# Patient Record
Sex: Female | Born: 1950 | Race: White | Hispanic: No | Marital: Married | Smoking: Never smoker
Health system: Southern US, Community
[De-identification: ages and names within clinical notes are randomized; demographics above are authoritative.]

## PROBLEM LIST (undated history)

## (undated) DIAGNOSIS — S52501A Unspecified fracture of the lower end of right radius, initial encounter for closed fracture: Secondary | ICD-10-CM

## (undated) DIAGNOSIS — R112 Nausea with vomiting, unspecified: Secondary | ICD-10-CM

## (undated) DIAGNOSIS — I10 Essential (primary) hypertension: Secondary | ICD-10-CM

## (undated) DIAGNOSIS — I779 Disorder of arteries and arterioles, unspecified: Secondary | ICD-10-CM

## (undated) DIAGNOSIS — M1990 Unspecified osteoarthritis, unspecified site: Secondary | ICD-10-CM

## (undated) DIAGNOSIS — K219 Gastro-esophageal reflux disease without esophagitis: Secondary | ICD-10-CM

## (undated) HISTORY — PX: TONSILLECTOMY AND ADENOIDECTOMY: SUR1326

## (undated) HISTORY — DX: Essential (primary) hypertension: I10

## (undated) HISTORY — DX: Disorder of arteries and arterioles, unspecified: I77.9

---

## 2015-04-28 ENCOUNTER — Other Ambulatory Visit: Payer: Self-pay | Admitting: Orthopedic Surgery

## 2015-05-04 ENCOUNTER — Encounter (HOSPITAL_BASED_OUTPATIENT_CLINIC_OR_DEPARTMENT_OTHER): Payer: Self-pay | Admitting: *Deleted

## 2015-05-04 ENCOUNTER — Ambulatory Visit (HOSPITAL_BASED_OUTPATIENT_CLINIC_OR_DEPARTMENT_OTHER): Payer: Self-pay | Admitting: Anesthesiology

## 2015-05-04 ENCOUNTER — Ambulatory Visit (HOSPITAL_BASED_OUTPATIENT_CLINIC_OR_DEPARTMENT_OTHER)
Admission: RE | Admit: 2015-05-04 | Discharge: 2015-05-04 | Disposition: A | Discharge status: Home / Self Care | Payer: Self-pay | Source: Ambulatory Visit | Attending: Orthopedic Surgery | Admitting: Orthopedic Surgery

## 2015-05-04 ENCOUNTER — Encounter (HOSPITAL_BASED_OUTPATIENT_CLINIC_OR_DEPARTMENT_OTHER)
Admission: RE | Disposition: A | Discharge status: Home / Self Care | Payer: Self-pay | Source: Ambulatory Visit | Attending: Orthopedic Surgery

## 2015-05-04 DIAGNOSIS — K219 Gastro-esophageal reflux disease without esophagitis: Secondary | ICD-10-CM | POA: Insufficient documentation

## 2015-05-04 DIAGNOSIS — S52501A Unspecified fracture of the lower end of right radius, initial encounter for closed fracture: Secondary | ICD-10-CM | POA: Diagnosis present

## 2015-05-04 DIAGNOSIS — I10 Essential (primary) hypertension: Secondary | ICD-10-CM | POA: Insufficient documentation

## 2015-05-04 DIAGNOSIS — X58XXXA Exposure to other specified factors, initial encounter: Secondary | ICD-10-CM | POA: Insufficient documentation

## 2015-05-04 HISTORY — DX: Unspecified fracture of the lower end of right radius, initial encounter for closed fracture: S52.501A

## 2015-05-04 HISTORY — DX: Essential (primary) hypertension: I10

## 2015-05-04 HISTORY — DX: Gastro-esophageal reflux disease without esophagitis: K21.9

## 2015-05-04 HISTORY — PX: OPEN REDUCTION INTERNAL FIXATION (ORIF) DISTAL RADIAL FRACTURE: SHX5989

## 2015-05-04 LAB — POCT I-STAT, CHEM 8
BUN: 18 mg/dL (ref 6–20)
CALCIUM ION: 1.14 mmol/L (ref 1.13–1.30)
Chloride: 108 mmol/L (ref 101–111)
Creatinine, Ser: 0.8 mg/dL (ref 0.44–1.00)
Glucose, Bld: 105 mg/dL — ABNORMAL HIGH (ref 65–99)
HEMATOCRIT: 38 % (ref 36.0–46.0)
HEMOGLOBIN: 12.9 g/dL (ref 12.0–15.0)
Potassium: 4.1 mmol/L (ref 3.5–5.1)
SODIUM: 140 mmol/L (ref 135–145)
TCO2: 23 mmol/L (ref 0–100)

## 2015-05-04 SURGERY — OPEN REDUCTION INTERNAL FIXATION (ORIF) DISTAL RADIUS FRACTURE
Anesthesia: General | Site: Wrist | Laterality: Right

## 2015-05-04 MED ORDER — HYDROCODONE-ACETAMINOPHEN 10-325 MG PO TABS: 1.0000 | ORAL_TABLET | Freq: Four times a day (QID) | ORAL | Status: DC | PRN

## 2015-05-04 MED ORDER — SENNA-DOCUSATE SODIUM 8.6-50 MG PO TABS: 2.0000 | ORAL_TABLET | Freq: Every day | ORAL | Status: DC

## 2015-05-04 MED ORDER — PROPOFOL 500 MG/50ML IV EMUL
INTRAVENOUS | Status: AC
  Filled 2015-05-04: qty 50

## 2015-05-04 MED ORDER — MIDAZOLAM HCL 2 MG/2ML IJ SOLN
1.0000 mg | INTRAMUSCULAR | Status: DC | PRN
  Administered 2015-05-04 (×2): 1 mg via INTRAVENOUS

## 2015-05-04 MED ORDER — HYDROMORPHONE HCL 1 MG/ML IJ SOLN: 0.2500 mg | INTRAMUSCULAR | Status: DC | PRN

## 2015-05-04 MED ORDER — OXYCODONE HCL 5 MG PO TABS: 5.0000 mg | ORAL_TABLET | Freq: Once | ORAL | Status: DC | PRN

## 2015-05-04 MED ORDER — EPHEDRINE SULFATE 50 MG/ML IJ SOLN
INTRAMUSCULAR | Status: DC | PRN
  Administered 2015-05-04: 15 mg via INTRAVENOUS

## 2015-05-04 MED ORDER — LACTATED RINGERS IV SOLN
INTRAVENOUS | Status: DC
  Administered 2015-05-04 (×2): via INTRAVENOUS

## 2015-05-04 MED ORDER — OXYCODONE HCL 5 MG/5ML PO SOLN: 5.0000 mg | Freq: Once | ORAL | Status: DC | PRN

## 2015-05-04 MED ORDER — GLYCOPYRROLATE 0.2 MG/ML IJ SOLN: 0.2000 mg | Freq: Once | INTRAMUSCULAR | Status: DC | PRN

## 2015-05-04 MED ORDER — ATROPINE SULFATE 0.4 MG/ML IJ SOLN
INTRAMUSCULAR | Status: AC
  Filled 2015-05-04: qty 1

## 2015-05-04 MED ORDER — FENTANYL CITRATE (PF) 100 MCG/2ML IJ SOLN
INTRAMUSCULAR | Status: AC
  Filled 2015-05-04: qty 2

## 2015-05-04 MED ORDER — ONDANSETRON HCL 4 MG/2ML IJ SOLN
INTRAMUSCULAR | Status: AC
  Filled 2015-05-04: qty 2

## 2015-05-04 MED ORDER — DEXAMETHASONE SODIUM PHOSPHATE 4 MG/ML IJ SOLN
INTRAMUSCULAR | Status: DC | PRN
  Administered 2015-05-04: 8 mg via INTRAVENOUS

## 2015-05-04 MED ORDER — SUCCINYLCHOLINE 20MG/ML (10ML) SYRINGE FOR MEDFUSION PUMP - OPTIME
INTRAMUSCULAR | Status: DC | PRN
  Administered 2015-05-04: 80 mg via INTRAVENOUS

## 2015-05-04 MED ORDER — MEPERIDINE HCL 25 MG/ML IJ SOLN: 6.2500 mg | INTRAMUSCULAR | Status: DC | PRN

## 2015-05-04 MED ORDER — PROPOFOL 10 MG/ML IV BOLUS
INTRAVENOUS | Status: DC | PRN
  Administered 2015-05-04: 50 mg via INTRAVENOUS
  Administered 2015-05-04: 150 mg via INTRAVENOUS

## 2015-05-04 MED ORDER — ONDANSETRON HCL 4 MG/2ML IJ SOLN
INTRAMUSCULAR | Status: DC | PRN
  Administered 2015-05-04: 4 mg via INTRAVENOUS

## 2015-05-04 MED ORDER — LIDOCAINE HCL (CARDIAC) 20 MG/ML IV SOLN
INTRAVENOUS | Status: AC
  Filled 2015-05-04: qty 5

## 2015-05-04 MED ORDER — PHENYLEPHRINE HCL 10 MG/ML IJ SOLN
10.0000 mg | INTRAVENOUS | Status: DC | PRN
  Administered 2015-05-04: 40 ug/min via INTRAVENOUS

## 2015-05-04 MED ORDER — CEFAZOLIN SODIUM-DEXTROSE 2-4 GM/100ML-% IV SOLN
INTRAVENOUS | Status: AC
  Filled 2015-05-04: qty 100

## 2015-05-04 MED ORDER — CEFAZOLIN SODIUM-DEXTROSE 2-4 GM/100ML-% IV SOLN
2.0000 g | INTRAVENOUS | Status: AC
  Administered 2015-05-04: 2 g via INTRAVENOUS

## 2015-05-04 MED ORDER — DEXAMETHASONE SODIUM PHOSPHATE 10 MG/ML IJ SOLN
INTRAMUSCULAR | Status: AC
  Filled 2015-05-04: qty 1

## 2015-05-04 MED ORDER — MIDAZOLAM HCL 2 MG/2ML IJ SOLN
INTRAMUSCULAR | Status: AC
Start: 2015-05-04 — End: 2015-05-04
  Filled 2015-05-04: qty 2

## 2015-05-04 MED ORDER — FENTANYL CITRATE (PF) 100 MCG/2ML IJ SOLN
50.0000 ug | INTRAMUSCULAR | Status: AC | PRN
  Administered 2015-05-04 (×2): 25 ug via INTRAVENOUS
  Administered 2015-05-04: 100 ug via INTRAVENOUS

## 2015-05-04 MED ORDER — SUCCINYLCHOLINE CHLORIDE 20 MG/ML IJ SOLN
INTRAMUSCULAR | Status: AC
  Filled 2015-05-04: qty 1

## 2015-05-04 MED ORDER — EPHEDRINE SULFATE 50 MG/ML IJ SOLN
INTRAMUSCULAR | Status: AC
  Filled 2015-05-04: qty 1

## 2015-05-04 MED ORDER — PHENYLEPHRINE 40 MCG/ML (10ML) SYRINGE FOR IV PUSH (FOR BLOOD PRESSURE SUPPORT)
PREFILLED_SYRINGE | INTRAVENOUS | Status: AC
  Filled 2015-05-04: qty 10

## 2015-05-04 MED ORDER — MIDAZOLAM HCL 2 MG/2ML IJ SOLN
INTRAMUSCULAR | Status: AC
  Filled 2015-05-04: qty 2

## 2015-05-04 MED ORDER — SCOPOLAMINE 1 MG/3DAYS TD PT72: 1.0000 | MEDICATED_PATCH | Freq: Once | TRANSDERMAL | Status: DC | PRN

## 2015-05-04 MED ORDER — BUPIVACAINE-EPINEPHRINE (PF) 0.5% -1:200000 IJ SOLN
INTRAMUSCULAR | Status: DC | PRN
  Administered 2015-05-04: 25 mL via PERINEURAL

## 2015-05-04 SURGICAL SUPPLY — 68 items
BANDAGE ACE 3X5.8 VEL STRL LF (GAUZE/BANDAGES/DRESSINGS) ×3 IMPLANT
BANDAGE ACE 4X5 VEL STRL LF (GAUZE/BANDAGES/DRESSINGS) IMPLANT
BIT DRILL 2 FAST STEP (BIT) ×3 IMPLANT
BIT DRILL 2.5X4 QC (BIT) ×3 IMPLANT
BLADE MINI RND TIP GREEN BEAV (BLADE) IMPLANT
BLADE SURG 15 STRL LF DISP TIS (BLADE) ×1 IMPLANT
BLADE SURG 15 STRL SS (BLADE) ×2
BNDG COHESIVE 4X5 TAN STRL (GAUZE/BANDAGES/DRESSINGS) ×3 IMPLANT
BNDG ESMARK 4X9 LF (GAUZE/BANDAGES/DRESSINGS) ×3 IMPLANT
CLOSURE STERI-STRIP 1/2X4 (GAUZE/BANDAGES/DRESSINGS) ×1
CLSR STERI-STRIP ANTIMIC 1/2X4 (GAUZE/BANDAGES/DRESSINGS) ×2 IMPLANT
CORDS BIPOLAR (ELECTRODE) ×3 IMPLANT
COVER BACK TABLE 60X90IN (DRAPES) ×3 IMPLANT
CUFF TOURNIQUET SINGLE 18IN (TOURNIQUET CUFF) ×3 IMPLANT
DECANTER SPIKE VIAL GLASS SM (MISCELLANEOUS) IMPLANT
DRAPE EXTREMITY T 121X128X90 (DRAPE) ×3 IMPLANT
DRAPE IMP U-DRAPE 54X76 (DRAPES) ×3 IMPLANT
DRAPE OEC MINIVIEW 54X84 (DRAPES) ×3 IMPLANT
DRAPE SURG 17X23 STRL (DRAPES) ×3 IMPLANT
DURAPREP 26ML APPLICATOR (WOUND CARE) ×3 IMPLANT
GAUZE SPONGE 4X4 12PLY STRL (GAUZE/BANDAGES/DRESSINGS) ×3 IMPLANT
GLOVE BIO SURGEON STRL SZ8 (GLOVE) ×3 IMPLANT
GLOVE BIOGEL PI IND STRL 7.0 (GLOVE) ×2 IMPLANT
GLOVE BIOGEL PI IND STRL 8 (GLOVE) ×2 IMPLANT
GLOVE BIOGEL PI INDICATOR 7.0 (GLOVE) ×4
GLOVE BIOGEL PI INDICATOR 8 (GLOVE) ×4
GLOVE ECLIPSE 6.5 STRL STRAW (GLOVE) ×3 IMPLANT
GLOVE ORTHO TXT STRL SZ7.5 (GLOVE) ×3 IMPLANT
GOWN STRL REUS W/ TWL LRG LVL3 (GOWN DISPOSABLE) ×1 IMPLANT
GOWN STRL REUS W/ TWL XL LVL3 (GOWN DISPOSABLE) ×2 IMPLANT
GOWN STRL REUS W/TWL LRG LVL3 (GOWN DISPOSABLE) ×2
GOWN STRL REUS W/TWL XL LVL3 (GOWN DISPOSABLE) ×4
K-WIRE 1.6 (WIRE) ×2
K-WIRE FX5X1.6XNS BN SS (WIRE) ×1
KWIRE FX5X1.6XNS BN SS (WIRE) ×1 IMPLANT
NEEDLE HYPO 25X1 1.5 SAFETY (NEEDLE) IMPLANT
NS IRRIG 1000ML POUR BTL (IV SOLUTION) ×3 IMPLANT
PACK BASIN DAY SURGERY FS (CUSTOM PROCEDURE TRAY) ×3 IMPLANT
PAD CAST 3X4 CTTN HI CHSV (CAST SUPPLIES) ×1 IMPLANT
PAD CAST 4YDX4 CTTN HI CHSV (CAST SUPPLIES) IMPLANT
PADDING CAST ABS 4INX4YD NS (CAST SUPPLIES) ×2
PADDING CAST ABS COTTON 4X4 ST (CAST SUPPLIES) ×1 IMPLANT
PADDING CAST COTTON 3X4 STRL (CAST SUPPLIES) ×2
PADDING CAST COTTON 4X4 STRL (CAST SUPPLIES)
PEG SUBCHONDRAL SMOOTH 2.0X16 (Peg) ×6 IMPLANT
PEG SUBCHONDRAL SMOOTH 2.0X18 (Peg) ×12 IMPLANT
PLATE SHORT 21.6X48.9 NRRW RT (Plate) ×3 IMPLANT
SCREW BN 12X3.5XNS CORT TI (Screw) ×2 IMPLANT
SCREW CORT 3.5X12 (Screw) ×4 IMPLANT
SLEEVE SCD COMPRESS KNEE MED (MISCELLANEOUS) ×3 IMPLANT
SLING ARM FOAM STRAP MED (SOFTGOODS) ×3 IMPLANT
SPLINT PLASTER CAST XFAST 3X15 (CAST SUPPLIES) ×10 IMPLANT
SPLINT PLASTER XTRA FASTSET 3X (CAST SUPPLIES) ×20
SUCTION FRAZIER HANDLE 10FR (MISCELLANEOUS) ×2
SUCTION TUBE FRAZIER 10FR DISP (MISCELLANEOUS) ×1 IMPLANT
SUT ETHILON 3 0 PS 1 (SUTURE) IMPLANT
SUT ETHILON 4 0 PS 2 18 (SUTURE) IMPLANT
SUT MNCRL AB 4-0 PS2 18 (SUTURE) IMPLANT
SUT VIC AB 0 CT1 27 (SUTURE)
SUT VIC AB 0 CT1 27XBRD ANBCTR (SUTURE) IMPLANT
SUT VICRYL 3-0 CR8 SH (SUTURE) ×3 IMPLANT
SYR BULB 3OZ (MISCELLANEOUS) ×3 IMPLANT
SYR CONTROL 10ML LL (SYRINGE) IMPLANT
TOWEL OR 17X24 6PK STRL BLUE (TOWEL DISPOSABLE) ×3 IMPLANT
TOWEL OR NON WOVEN STRL DISP B (DISPOSABLE) ×3 IMPLANT
TUBE CONNECTING 20'X1/4 (TUBING) ×1
TUBE CONNECTING 20X1/4 (TUBING) ×2 IMPLANT
UNDERPAD 30X30 (UNDERPADS AND DIAPERS) IMPLANT

## 2015-05-04 NOTE — Anesthesia Postprocedure Evaluation (Signed)
Anesthesia Post Note  Patient: Katie Chan  Procedure(s) Performed: Procedure(s) (LRB): OPEN REDUCTION INTERNAL FIXATION (ORIF) RIGHT DISTAL RADIUS FRACTURE (Right)  Patient location during evaluation: PACU Anesthesia Type: General Level of consciousness: awake and alert Pain management: pain level controlled Vital Signs Assessment: post-procedure vital signs reviewed and stable Respiratory status: spontaneous breathing, nonlabored ventilation and respiratory function stable Cardiovascular status: blood pressure returned to baseline and stable Postop Assessment: no signs of nausea or vomiting Anesthetic complications: no    Last Vitals:  Filed Vitals:   05/04/15 1234 05/04/15 1245  BP: 141/69 138/65  Pulse: 99 92  Temp: 36.6 C   Resp: 39 15    Last Pain: There were no vitals filed for this visit.               Dillard Pascal A

## 2015-05-04 NOTE — H&P (Signed)
PREOPERATIVE H&P  Chief Complaint: RIGHT DISTAL RADIAL FRACTURE  HPI: Katie Chan is a 65 y.o. female who presents for preoperative history and physical with a diagnosis of RIGHT DISTAL RADIAL FRACTURE. Symptoms are rated as moderate to severe, and have been worsening.  This is significantly impairing activities of daily living.  She has elected for surgical management. This was an acute injury, after a fall that happened last week. Denies any loss of sensation. Positive pain that is severe over the right wrist. She is right-hand dominant.  Past Medical History  Diagnosis Date  . Hypertension   . GERD (gastroesophageal reflux disease)    Past Surgical History  Procedure Laterality Date  . Tonsillectomy and adenoidectomy     Social History   Social History  . Marital Status: Married    Spouse Name: N/A  . Number of Children: N/A  . Years of Education: N/A   Social History Main Topics  . Smoking status: Never Smoker   . Smokeless tobacco: None  . Alcohol Use: None  . Drug Use: None  . Sexual Activity: Not Asked   Other Topics Concern  . None   Social History Narrative  . None   History reviewed. No pertinent family history. No Known Allergies Prior to Admission medications   Medication Sig Start Date End Date Taking? Authorizing Provider  HYDROcodone-acetaminophen (NORCO/VICODIN) 5-325 MG tablet Take 1 tablet by mouth every 4 (four) hours as needed for moderate pain.   Yes Historical Provider, MD  lisinopril-hydrochlorothiazide (PRINZIDE,ZESTORETIC) 20-12.5 MG tablet Take 1 tablet by mouth 2 (two) times daily.   Yes Historical Provider, MD  metoprolol tartrate (LOPRESSOR) 25 MG tablet Take 25 mg by mouth 2 (two) times daily.   Yes Historical Provider, MD  sertraline (ZOLOFT) 50 MG tablet Take 50 mg by mouth daily.   Yes Historical Provider, MD     Positive ROS: All other systems have been reviewed and were otherwise negative with the exception of those mentioned in  the HPI and as above.  Physical Exam: General: Alert, no acute distress Cardiovascular: No pedal edema Respiratory: No cyanosis, no use of accessory musculature GI: No organomegaly, abdomen is soft and non-tender Skin: No lesions in the area of chief complaint Neurologic: Sensation intact distally Psychiatric: Patient is competent for consent with normal mood and affect Lymphatic: No axillary or cervical lymphadenopathy  MUSCULOSKELETAL: Right hand has sensation intact throughout, all fingers flex extend and abduct, positive ecchymosis and tenderness to palpation over the distal radius.  Assessment: RIGHT DISTAL RADIAL FRACTURE   Plan: Plan for Procedure(s): OPEN REDUCTION INTERNAL FIXATION (ORIF) RIGHT DISTAL RADIUS FRACTURE  The risks benefits and alternatives were discussed with the patient including but not limited to the risks of nonoperative treatment, versus surgical intervention including infection, bleeding, nerve injury, malunion, nonunion, the need for revision surgery, hardware prominence, hardware failure, the need for hardware removal, blood clots, cardiopulmonary complications, morbidity, mortality, among others, and they were willing to proceed.     Eulas PostLANDAU,Ilsa Bonello P, MD Cell [phone]   05/04/2015 10:59 AM

## 2015-05-04 NOTE — Anesthesia Procedure Notes (Addendum)
Anesthesia Regional Block:  Infraclavicular brachial plexus block  Pre-Anesthetic Checklist: ,, timeout performed, Correct Patient, Correct Site, Correct Laterality, Correct Procedure, Correct Position, site marked, Risks and benefits discussed,  Surgical consent,  Pre-op evaluation,  At surgeon's request and post-op pain management  Laterality: Right and Upper  Prep: chloraprep       Needles:  Injection technique: Single-shot  Needle Type: Echogenic Stimulator Needle     Needle Length: 5cm 5 cm Needle Gauge: 21 and 21 G    Additional Needles:  Procedures: ultrasound guided (picture in chart) Infraclavicular brachial plexus block Narrative:  Start time: 05/04/2015 10:43 AM End time: 05/04/2015 10:48 AM Injection made incrementally with aspirations every 5 mL.  Performed by: Personally  Anesthesiologist: CREWS, DAVID   Procedure Name: LMA Insertion Date/Time: 05/04/2015 11:13 AM Performed by: Zenia ResidesPAYNE, Jameela Michna D Pre-anesthesia Checklist: Patient identified, Emergency Drugs available, Suction available and Patient being monitored Patient Re-evaluated:Patient Re-evaluated prior to inductionOxygen Delivery Method: Circle System Utilized Preoxygenation: Pre-oxygenation with 100% oxygen Intubation Type: IV induction Ventilation: Mask ventilation without difficulty LMA: LMA inserted LMA Size: 4.0 Number of attempts: 1 Airway Equipment and Method: Bite block Placement Confirmation: positive ETCO2 Tube secured with: Tape Dental Injury: Teeth and Oropharynx as per pre-operative assessment       Right Infraclavicular block image

## 2015-05-04 NOTE — Progress Notes (Signed)
Assisted Dr. Crews with right, ultrasound guided, supraclavicular block. Side rails up, monitors on throughout procedure. See vital signs in flow sheet. Tolerated Procedure well. 

## 2015-05-04 NOTE — Op Note (Signed)
05/04/2015  12:20 PM  PATIENT:  Katie Chan    PRE-OPERATIVE DIAGNOSIS:  RIGHT DISTAL RADIAL FRACTURE closed, intra-articular, displaced, acute, comminuted  POST-OPERATIVE DIAGNOSIS:  Same  PROCEDURE:  ORIF DISTAL RADIUS FRACTURE, 3 PIECES  SURGEON:  Eulas PostLANDAU,Mahalia Dykes P, MD  PHYSICIAN ASSISTANT: Janace LittenBrandon Parry, OPA-C, present and scrubbed throughout the case, critical for completion in a timely fashion, and for retraction, instrumentation, and closure.  ANESTHESIA:   General  PREOPERATIVE INDICATIONS:  Katie Chan is a  65 y.o. female with a diagnosis of RIGHT DISTAL RADIAL FRACTURE who elected for surgical management due to fracture displacement.    The risks benefits and alternatives were discussed with the patient preoperatively including but not limited to the risks of infection, bleeding, nerve injury, cardiopulmonary complications, the need for revision surgery, tendon rupture, hardware prominence, hardware failure, nonunion, malunion, post-traumatic arthritis, regional pain syndrome, among others, and the patient was willing to proceed.  OPERATIVE IMPLANTS: Biomet DVR volar plate with 2 proximal cortical screws and multiple distal interlocking smooth pegs, using the short narrow plate.   OPERATIVE FINDINGS: Comminution of the distal radius fracture. The radial styloid segment was a separate segment, which ultimately I achieved near anatomic reduction. There was just a slight bit of diastases at the junction of the lunar facet and the radial styloid scaphoid facet. Bone quality was mediocre.  OPERATIVE PROCEDURE: The patient was brought to the operating room and placed in the supine position. General anesthesia was administered. IV antibiotics were given. Time out was performed. The upper extremity was prepped and draped in usual sterile fashion. The arm was elevated and exsanguinated and the tourniquet was inflated at 250mm hg.    Volar approach to the distal radius was carried  out, and the flexor carpi radialis was retracted radially. The radial artery was protected throughout the case.   Deep dissection was carried down, and the pronator quadratus was elevated off of the radius. The fracture site was identified and cleaned and reduced anatomically. This keyed into place nicely.   I held this provisionally with a K wire, and C-arm used to confirm alignment.   I had restored height and inclination and then applied a volar plate. Once I was satisfied with the overall alignment I was able to secure the plate proximally with a cortical screw.   I then secured the fracture with multiple smooth interlocking pegs distally, and confirmed that none of these were in the joint, and none of these were penetrating the dorsal cortex. I also secured the plate proximally with one more cortical screw. On the ulnar side of the plate distally I used shorter pegs than on the radial side.    The wounds were irrigated copiously, and then closed the skin with subcutaneous Vicryl and Steri-Strips and sterile gauze and a volar splint. The tourniquet was released. She was awakened and returned back in stable and satisfactory condition. There were no complications and She tolerated the procedure well.

## 2015-05-04 NOTE — Transfer of Care (Signed)
Immediate Anesthesia Transfer of Care Note  Patient: Katie Chan  Procedure(s) Performed: Procedure(s): OPEN REDUCTION INTERNAL FIXATION (ORIF) RIGHT DISTAL RADIUS FRACTURE (Right)  Patient Location: PACU  Anesthesia Type:GA combined with regional for post-op pain  Level of Consciousness: awake, sedated and patient cooperative  Airway & Oxygen Therapy: Patient Spontanous Breathing and Patient connected to face mask oxygen  Post-op Assessment: Report given to RN and Post -op Vital signs reviewed and stable  Post vital signs: Reviewed and stable  Last Vitals:  Filed Vitals:   05/04/15 1045 05/04/15 1050  BP: 135/64 130/56  Pulse: 66 62  Temp:    Resp: 9 14    Complications: No apparent anesthesia complications

## 2015-05-04 NOTE — Anesthesia Preprocedure Evaluation (Signed)
Anesthesia Evaluation  Patient identified by MRN, date of birth, ID band Patient awake    Reviewed: Allergy & Precautions, NPO status , Patient's Chart, lab work & pertinent test results  Airway Mallampati: I  TM Distance: >3 FB Neck ROM: Full    Dental  (+) Partial Upper, Dental Advisory Given, Teeth Intact   Pulmonary    breath sounds clear to auscultation       Cardiovascular hypertension, Pt. on medications and Pt. on home beta blockers  Rhythm:Regular Rate:Normal     Neuro/Psych    GI/Hepatic GERD  Medicated and Controlled,  Endo/Other    Renal/GU      Musculoskeletal   Abdominal   Peds  Hematology   Anesthesia Other Findings   Reproductive/Obstetrics                             Anesthesia Physical Anesthesia Plan  ASA: II  Anesthesia Plan: General   Post-op Pain Management: GA combined w/ Regional for post-op pain   Induction: Intravenous  Airway Management Planned: LMA  Additional Equipment:   Intra-op Plan:   Post-operative Plan: Extubation in OR  Informed Consent: I have reviewed the patients History and Physical, chart, labs and discussed the procedure including the risks, benefits and alternatives for the proposed anesthesia with the patient or authorized representative who has indicated his/her understanding and acceptance.   Dental advisory given  Plan Discussed with: CRNA, Anesthesiologist and Surgeon  Anesthesia Plan Comments:         Anesthesia Quick Evaluation

## 2015-05-04 NOTE — Discharge Instructions (Signed)
Diet: As you were doing prior to hospitalization   Shower:  May shower but keep the wounds dry, use an occlusive plastic wrap, NO SOAKING IN TUB.  If the bandage gets wet, change with a clean dry gauze.  If you have a splint on, leave the splint in place and keep the splint dry with a plastic bag.  Dressing:  You may change your dressing 3-5 days after surgery, unless you have a splint.  If you have a splint, then just leave the splint in place and we will change your bandages during your first follow-up appointment.    If you had hand or foot surgery, we will plan to remove your stitches in about 2 weeks in the office.  For all other surgeries, there are sticky tapes (steri-strips) on your wounds and all the stitches are absorbable.  Leave the steri-strips in place when changing your dressings, they will peel off with time, usually 2-3 weeks.  Activity:  Increase activity slowly as tolerated, but follow the weight bearing instructions below.  The rules on driving is that you can not be taking narcotics while you drive, and you must feel in control of the vehicle.    Weight Bearing:   No lifting with right hand.  Ok for finger motion.    To prevent constipation: you may use a stool softener such as -  Colace (over the counter) 100 mg by mouth twice a day  Drink plenty of fluids (prune juice may be helpful) and high fiber foods Miralax (over the counter) for constipation as needed.    Itching:  If you experience itching with your medications, try taking only a single pain pill, or even half a pain pill at a time.  You may take up to 10 pain pills per day, and you can also use benadryl over the counter for itching or also to help with sleep.   Precautions:  If you experience chest pain or shortness of breath - call 911 immediately for transfer to the hospital emergency department!!  If you develop a fever greater that 101 F, purulent drainage from wound, increased redness or drainage from wound,  or calf pain -- Call the office at [phone]                                                Follow- Up Appointment:  Please call for an appointment to be [place] 2 [place] [phone]      Post Anesthesia Home Care Instructions  Activity: Get plenty of rest for the remainder of the day. A responsible adult should stay with you for 24 hours following the procedure.  For the next 24 hours, DO NOT: -Drive a car -Advertising copywriterperate machinery -Drink alcoholic beverages -Take any medication unless instructed by your physician -Make any legal decisions or sign important papers.  Meals: Start with liquid foods such as gelatin or soup. Progress to regular foods as tolerated. Avoid greasy, spicy, heavy foods. If nausea and/or vomiting occur, drink only clear liquids until the nausea and/or vomiting subsides. Call your physician if vomiting continues.  Special Instructions/Symptoms: Your throat may feel dry or sore from the anesthesia or the breathing tube placed in your throat during surgery. If this causes discomfort, gargle with warm salt water. The discomfort should disappear within 24 hours.  If you had a scopolamine  patch placed behind your ear for the management of post- operative nausea and/or vomiting:  1. The medication in the patch is effective for 72 hours, after which it should be removed.  Wrap patch in a tissue and discard in the trash. Wash hands thoroughly with soap and water. 2. You may remove the patch earlier than 72 hours if you experience unpleasant side effects which may include dry mouth, dizziness or visual disturbances. 3. Avoid touching the patch. Wash your hands with soap and water after contact with the patch.   Regional Anesthesia Blocks  1. Numbness or the inability to move the "blocked" extremity may last from 3-48 hours after placement. The length of time depends on the medication injected and your individual response to the medication. If the numbness is  not going away after 48 hours, call your surgeon.  2. The extremity that is blocked will need to be protected until the numbness is gone and the  Strength has returned. Because you cannot feel it, you will need to take extra care to avoid injury. Because it may be weak, you may have difficulty moving it or using it. You may not know what position it is in without looking at it while the block is in effect.  3. For blocks in the legs and feet, returning to weight bearing and walking needs to be done carefully. You will need to wait until the numbness is entirely gone and the strength has returned. You should be able to move your leg and foot normally before you try and bear weight or walk. You will need someone to be with you when you first try to ensure you do not fall and possibly risk injury.  4. Bruising and tenderness at the needle site are common side effects and will resolve in a few days.  5. Persistent numbness or new problems with movement should be communicated to the surgeon or the [facility] [phone] [facility] [phone]).

## 2015-05-05 ENCOUNTER — Encounter (HOSPITAL_BASED_OUTPATIENT_CLINIC_OR_DEPARTMENT_OTHER): Payer: Self-pay | Admitting: Orthopedic Surgery

## 2017-03-14 DIAGNOSIS — I10 Essential (primary) hypertension: Secondary | ICD-10-CM | POA: Diagnosis not present

## 2017-03-14 DIAGNOSIS — R5383 Other fatigue: Secondary | ICD-10-CM | POA: Diagnosis not present

## 2017-06-21 DIAGNOSIS — R079 Chest pain, unspecified: Secondary | ICD-10-CM | POA: Diagnosis not present

## 2017-06-21 DIAGNOSIS — I10 Essential (primary) hypertension: Secondary | ICD-10-CM | POA: Diagnosis not present

## 2017-06-21 DIAGNOSIS — R5383 Other fatigue: Secondary | ICD-10-CM | POA: Diagnosis not present

## 2017-07-18 DIAGNOSIS — I10 Essential (primary) hypertension: Secondary | ICD-10-CM | POA: Diagnosis not present

## 2017-07-18 DIAGNOSIS — N183 Chronic kidney disease, stage 3 (moderate): Secondary | ICD-10-CM | POA: Diagnosis not present

## 2017-07-18 DIAGNOSIS — R5383 Other fatigue: Secondary | ICD-10-CM | POA: Diagnosis not present

## 2017-08-06 ENCOUNTER — Encounter: Payer: Self-pay | Admitting: *Deleted

## 2017-08-07 ENCOUNTER — Telehealth: Payer: Self-pay | Admitting: Cardiovascular Disease

## 2017-08-07 ENCOUNTER — Ambulatory Visit: Payer: Medicare Other | Admitting: Cardiovascular Disease

## 2017-08-07 ENCOUNTER — Encounter: Payer: Self-pay | Admitting: Cardiovascular Disease

## 2017-08-07 ENCOUNTER — Encounter: Payer: Self-pay | Admitting: *Deleted

## 2017-08-07 VITALS — BP 158/82 | HR 56 | Ht 62.0 in | Wt 147.4 lb

## 2017-08-07 DIAGNOSIS — R0602 Shortness of breath: Secondary | ICD-10-CM

## 2017-08-07 DIAGNOSIS — R42 Dizziness and giddiness: Secondary | ICD-10-CM

## 2017-08-07 DIAGNOSIS — I10 Essential (primary) hypertension: Secondary | ICD-10-CM | POA: Diagnosis not present

## 2017-08-07 DIAGNOSIS — R079 Chest pain, unspecified: Secondary | ICD-10-CM

## 2017-08-07 DIAGNOSIS — R001 Bradycardia, unspecified: Secondary | ICD-10-CM | POA: Diagnosis not present

## 2017-08-07 NOTE — Patient Instructions (Addendum)
Medication Instructions:  Continue all current medications.  Labwork: none  Testing/Procedures:  Your physician has requested that you have an exercise stress myoview. For further information please visit [email or web address]. Please follow instruction sheet, as given.  Your physician has recommended that you wear a 14 day event monitor. Event monitors are medical devices that record the heart's electrical activity. Doctors most often us these monitors to diagnose arrhythmias. Arrhythmias are problems with the speed or rhythm of the heartbeat. The monitor is a small, portable device. You can wear one while you do your normal daily activities. This is usually used to diagnose what is causing palpitations/syncope (passing out).  Office will contact with results via phone or letter.    Follow-Up: Next available.    Any Other Special Instructions Will Be Listed Below (If Applicable).  If you need a refill on your cardiac medications before your next appointment, please call your pharmacy.

## 2017-08-07 NOTE — Telephone Encounter (Signed)
Pre-cert Verification for the following procedure   Exercise myoview - chest pain scheduled for 08-09-2017

## 2017-08-07 NOTE — Progress Notes (Signed)
CARDIOLOGY CONSULT NOTE  Patient ID: Katie Chan MRN: [id number] DOB/AGE: 66-Jan-1952 66 y.o.  Admit date: (Not on file) Primary Physician: [name], [name], PA Referring Physician: Dr. Selinda FlavinKevin Howard  Reason for Consultation: Bradycardia and chest pain  HPI: Katie Chan is a 67 y.o. female who is being seen today for the evaluation of bradycardia and chest pain at the request of Selinda FlavinHoward, Kevin, MD.   Past medical history includes hypertension.  I reviewed extensive documentation from her PCP.  She tells me that for the past 2 months she has been experiencing retrosternal chest pressure associate with fatigue.  He can occur after walking her dog or doing routine activities of daily living around the house.  There is some radiation to the throat.  She also has bilateral arm tingling and numbness which can be associated with chest pressure or independently.  She describes that as a cramp-like sensation with symptoms lasting for less than a minute.  She has some dizziness but thought it might be related to cataracts.  She denies syncope.  She also has some shortness of breath.  She has some ankle and feet swelling as well.  I personally reviewed an ECG performed at her PCPs office which demonstrates sinus bradycardia, 51 bpm.  This was performed on 06/21/2017.  I reviewed labs dated 06/21/2017: BUN 25, creatinine 1.17, sodium 144, potassium 4.9, white blood cell 7.7, hemoglobin 11.2, platelets 302, total cholesterol 210, triglycerides 130, HDL 62, LDL 122.  Family history: Mother died of an MI at age 66.  Maternal grandmother died of an MI.  Father had 2 MIs, first one in his 6560s.  He was a smoker and also had COPD.  Paternal grandfather died of an MI at age 842.   No Known Allergies  Current Outpatient Medications  Medication Sig Dispense Refill  . Aspirin-Salicylamide-Caffeine (BC HEADACHE POWDER PO) Take 1 packet by mouth daily as needed.    . hydrALAZINE (APRESOLINE) 25  MG tablet Take 25 mg by mouth 3 (three) times daily.  0  . lisinopril-hydrochlorothiazide (PRINZIDE,ZESTORETIC) 20-12.5 MG tablet Take 1 tablet by mouth 2 (two) times daily.     No current facility-administered medications for this visit.     Past Medical History:  Diagnosis Date  . Fracture of radius, distal, right, closed 05/04/2015  . GERD (gastroesophageal reflux disease)   . Hypertension     Past Surgical History:  Procedure Laterality Date  . OPEN REDUCTION INTERNAL FIXATION (ORIF) DISTAL RADIAL FRACTURE Right 05/04/2015   Procedure: OPEN REDUCTION INTERNAL FIXATION (ORIF) RIGHT DISTAL RADIUS FRACTURE;  Surgeon: [name], MD;  Location: [facility];  Service: Orthopedics;  Laterality: Right;  . TONSILLECTOMY AND ADENOIDECTOMY      Social History   Socioeconomic History  . Marital status: Married    Spouse name: Not on file  . Number of children: Not on file  . Years of education: Not on file  . Highest education level: Not on file  Occupational History  . Not on file  Social Needs  . Financial resource strain: Not on file  . Food insecurity:    Worry: Not on file    Inability: Not on file  . Transportation needs:    Medical: Not on file    Non-medical: Not on file  Tobacco Use  . Smoking status: Never Smoker  . Smokeless tobacco: Never Used  Substance and Sexual Activity  . Alcohol use: Not on file  .  Drug use: Not on file  . Sexual activity: Not on file  Lifestyle  . Physical activity:    Days per week: Not on file    Minutes per session: Not on file  . Stress: Not on file  Relationships  . Social connections:    Talks on phone: Not on file    Gets together: Not on file    Attends religious service: Not on file    Active member of club or organization: Not on file    Attends meetings of clubs or organizations: Not on file    Relationship status: Not on file  . Intimate partner violence:    Fear of current or ex partner: Not on file     Emotionally abused: Not on file    Physically abused: Not on file    Forced sexual activity: Not on file  Other Topics Concern  . Not on file  Social History Narrative  . Not on file      Current Meds  Medication Sig  . Aspirin-Salicylamide-Caffeine (BC HEADACHE POWDER PO) Take 1 packet by mouth daily as needed.  . hydrALAZINE (APRESOLINE) 25 MG tablet Take 25 mg by mouth 3 (three) times daily.  Marland Kitchen lisinopril-hydrochlorothiazide (PRINZIDE,ZESTORETIC) 20-12.5 MG tablet Take 1 tablet by mouth 2 (two) times daily.      Review of systems complete and found to be negative unless listed above in HPI    Physical exam Blood pressure (!) 158/82, pulse (!) 56, height 5\' 2"  (1.575 m), weight 147 lb 6.4 oz (66.9 kg), SpO2 98 %. General: NAD Neck: No JVD, no thyromegaly or thyroid nodule.  Lungs: Clear to auscultation bilaterally with normal respiratory effort. CV: Nondisplaced PMI.  Bradycardic, regular rhythm, normal S1/S2, no S3/S4, 1/6 systolic murmur along left sternal border.  No peripheral edema.  No carotid bruit.  Abdomen: Soft, nontender, no distention.  Skin: Intact without lesions or rashes.  Neurologic: Alert and oriented x 3.  Psych: Normal affect. Extremities: No clubbing or cyanosis.  HEENT: Normal.   ECG: Most recent ECG reviewed.   Labs: Lab Results  Component Value Date/Time   K 4.1 05/04/2015 09:44 AM   BUN 18 05/04/2015 09:44 AM   CREATININE 0.80 05/04/2015 09:44 AM   HGB 12.9 05/04/2015 09:44 AM     Lipids: No results found for: LDLCALC, LDLDIRECT, CHOL, TRIG, HDL      ASSESSMENT AND PLAN:  1.  Chest pressure with shortness of breath: Symptoms have some typical features as they occur with exertion and with radiation to the throat.  There is a family history of heart disease.  Risk factors also include hypertension.  I will obtain an exercise Myoview stress test to evaluate for ischemic heart disease.  2.  Bradycardia with dizziness: No history of sleep  apnea or thyroid disease.  I will obtain a 2-week event monitor to evaluate for symptomatic bradycardia.    3.  Hypertension: Blood pressure is elevated today.  This will need continued monitoring.  Currently on hydralazine and Prinzide.   Disposition: Follow up in 2 months   Signed: [name], M.D., F.A.C.C.  08/07/2017, 9:30 AM

## 2017-08-09 ENCOUNTER — Encounter (HOSPITAL_COMMUNITY): Payer: Self-pay

## 2017-08-09 ENCOUNTER — Encounter (HOSPITAL_COMMUNITY)
Admission: RE | Admit: 2017-08-09 | Discharge: 2017-08-09 | Disposition: A | Discharge status: Home / Self Care | Payer: Medicare Other | Source: Ambulatory Visit | Attending: Cardiovascular Disease | Admitting: Cardiovascular Disease

## 2017-08-09 ENCOUNTER — Encounter (HOSPITAL_BASED_OUTPATIENT_CLINIC_OR_DEPARTMENT_OTHER)
Admission: RE | Admit: 2017-08-09 | Discharge: 2017-08-09 | Disposition: A | Discharge status: Home / Self Care | Payer: Medicare Other | Source: Ambulatory Visit | Attending: Cardiovascular Disease | Admitting: Cardiovascular Disease

## 2017-08-09 DIAGNOSIS — R079 Chest pain, unspecified: Secondary | ICD-10-CM

## 2017-08-09 LAB — NM MYOCAR MULTI W/SPECT W/WALL MOTION / EF
CHL RATE OF PERCEIVED EXERTION: 13
CSEPEW: 4.6 METS
Exercise duration (min): 3 min
Exercise duration (sec): 15 s
LVDIAVOL: 58 mL (ref 46–106)
LVSYSVOL: 46 mL
MPHR: 154 {beats}/min
NUC STRESS TID: 1.04
Peak HR: 136 {beats}/min
Percent HR: 88 %
RATE: 0.28
Rest HR: 51 {beats}/min
SDS: 1
SRS: 0
SSS: 1

## 2017-08-09 MED ORDER — TECHNETIUM TC 99M TETROFOSMIN IV KIT
10.0000 | PACK | Freq: Once | INTRAVENOUS | Status: AC | PRN
  Administered 2017-08-09: 11 via INTRAVENOUS

## 2017-08-09 MED ORDER — SODIUM CHLORIDE 0.9% FLUSH
INTRAVENOUS | Status: AC
  Filled 2017-08-09: qty 160

## 2017-08-09 MED ORDER — REGADENOSON 0.4 MG/5ML IV SOLN
INTRAVENOUS | Status: AC
  Filled 2017-08-09: qty 5

## 2017-08-09 MED ORDER — SODIUM CHLORIDE 0.9% FLUSH
INTRAVENOUS | Status: AC
  Administered 2017-08-09: 10 mL via INTRAVENOUS
  Filled 2017-08-09: qty 10

## 2017-08-09 MED ORDER — TECHNETIUM TC 99M TETROFOSMIN IV KIT
30.0000 | PACK | Freq: Once | INTRAVENOUS | Status: AC | PRN
  Administered 2017-08-09: 33 via INTRAVENOUS

## 2017-08-10 ENCOUNTER — Other Ambulatory Visit: Payer: Self-pay

## 2017-08-10 DIAGNOSIS — R931 Abnormal findings on diagnostic imaging of heart and coronary circulation: Secondary | ICD-10-CM

## 2017-08-10 DIAGNOSIS — H2513 Age-related nuclear cataract, bilateral: Secondary | ICD-10-CM | POA: Diagnosis not present

## 2017-08-10 DIAGNOSIS — H401431 Capsular glaucoma with pseudoexfoliation of lens, bilateral, mild stage: Secondary | ICD-10-CM | POA: Diagnosis not present

## 2017-08-10 DIAGNOSIS — H401430 Capsular glaucoma with pseudoexfoliation of lens, bilateral, stage unspecified: Secondary | ICD-10-CM | POA: Diagnosis not present

## 2017-08-12 DIAGNOSIS — R001 Bradycardia, unspecified: Secondary | ICD-10-CM | POA: Diagnosis not present

## 2017-08-13 ENCOUNTER — Ambulatory Visit (INDEPENDENT_AMBULATORY_CARE_PROVIDER_SITE_OTHER): Payer: Medicare Other

## 2017-08-13 DIAGNOSIS — R001 Bradycardia, unspecified: Secondary | ICD-10-CM | POA: Diagnosis not present

## 2017-08-14 ENCOUNTER — Ambulatory Visit (INDEPENDENT_AMBULATORY_CARE_PROVIDER_SITE_OTHER): Payer: Medicare Other

## 2017-08-14 ENCOUNTER — Other Ambulatory Visit: Payer: Self-pay

## 2017-08-14 DIAGNOSIS — R931 Abnormal findings on diagnostic imaging of heart and coronary circulation: Secondary | ICD-10-CM | POA: Diagnosis not present

## 2017-08-17 ENCOUNTER — Telehealth: Payer: Self-pay

## 2017-08-17 NOTE — Telephone Encounter (Signed)
-----   Message from Antoine PocheJonathan F Branch, MD sent at 08/17/2017  3:14 PM EDT ----- Echo looks good, normal heart function. Will discuss in detail with her PA f/u that is coming up. Covering for Dr Kirtland BouchardK, patient of his.    Dominga FerryJ Branch MD

## 2017-08-17 NOTE — Telephone Encounter (Signed)
Patient notified. Routed to PCP 

## 2017-08-30 ENCOUNTER — Ambulatory Visit (INDEPENDENT_AMBULATORY_CARE_PROVIDER_SITE_OTHER): Payer: Medicare Other | Admitting: Student

## 2017-08-30 ENCOUNTER — Encounter: Payer: Self-pay | Admitting: Student

## 2017-08-30 VITALS — BP 154/70 | HR 65 | Ht 62.0 in | Wt 148.0 lb

## 2017-08-30 DIAGNOSIS — R001 Bradycardia, unspecified: Secondary | ICD-10-CM

## 2017-08-30 DIAGNOSIS — I10 Essential (primary) hypertension: Secondary | ICD-10-CM | POA: Diagnosis not present

## 2017-08-30 DIAGNOSIS — R079 Chest pain, unspecified: Secondary | ICD-10-CM

## 2017-08-30 NOTE — Progress Notes (Signed)
Cardiology Office Note    Date:  08/30/2017   ID:  Katie MusterCarolyn S Chan, DOB December 15, 1950, MRN [id number]  PCP:  [name], [name], PA  Cardiologist: Prentice DockerSuresh Koneswaran, MD    Chief Complaint  Patient presents with  . Follow-up    1 month visit    History of Present Illness:    Katie Chan is a 67 y.o. female with past medical history of HTN and GERD who presents to the office today for one-month follow-up.  She was examined by Dr. Purvis SheffieldKoneswaran as a new patient referral on 08/07/2017 for frequent episodes of chest discomfort. She reported having retrosternal chest discomfort with associated fatigue over the past several months which would typically last for less than a minute then spontaneously resolve. EKG was performed and showed no acute ischemic changes. A Treadmill Myoview was recommended for ischemic evaluation and she was also placed on a 2-week event monitor to assess for symptomatic bradycardia as heart rate was in the 50's.  NST was obtained on 08/09/2017 and showed no perfusion defects consistent with prior infarct or current ischemia.  Her EF was read as being severely decreased at less than 30% therefore an echocardiogram was recommended for definitive assessment. This showed a preserved EF of 65-70% with no regional WMA, trivial AI, and trivial TR. Her event monitor showed normal sinus rhythm with some episodes of sinus tachycardia but no significant arrhythmias.  In talking with the patient today, she reports overall doing well from a cardiac perspective since her last office visit.  She denies any repeat episodes of chest discomfort. 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 Denies any recent dyspnea on exertion, orthopnea, PND, or lower extremity edema.  She was previously having issues with bradycardia and Metoprolol was stopped by her PCP and she was switched to Hydralazine 25 mg 3 times daily. Heart rate is in the  60's during today's visit.   Past Medical History:  Diagnosis Date  . Fracture of radius, distal, right, closed 05/04/2015  . GERD (gastroesophageal reflux disease)   . Hypertension     Past Surgical History:  Procedure Laterality Date  . OPEN REDUCTION INTERNAL FIXATION (ORIF) DISTAL RADIAL FRACTURE Right 05/04/2015   Procedure: OPEN REDUCTION INTERNAL FIXATION (ORIF) RIGHT DISTAL RADIUS FRACTURE;  Surgeon: [name], MD;  Location: [facility];  Service: Orthopedics;  Laterality: Right;  . TONSILLECTOMY AND ADENOIDECTOMY      Current Medications: Outpatient Medications Prior to Visit  Medication Sig Dispense Refill  . Aspirin-Salicylamide-Caffeine (BC HEADACHE POWDER PO) Take 1 packet by mouth daily as needed.    . hydrALAZINE (APRESOLINE) 25 MG tablet Take 25 mg by mouth 3 (three) times daily.  0  . lisinopril-hydrochlorothiazide (PRINZIDE,ZESTORETIC) 20-12.5 MG tablet Take 1 tablet by mouth 2 (two) times daily.     No facility-administered medications prior to visit.      Allergies:   Patient has no known allergies.   Social History   Socioeconomic History  . Marital status: Married    Spouse name: Not on file  . Number of children: Not on file  . Years of education: Not on file  . Highest education level: Not on file  Occupational History  . Not on file  Social Needs  . Financial resource strain: Not on file  . Food insecurity:    Worry: Not on file    Inability: Not on file  . Transportation needs:  Medical: Not on file    Non-medical: Not on file  Tobacco Use  . Smoking status: Never Smoker  . Smokeless tobacco: Never Used  Substance and Sexual Activity  . Alcohol use: Not on file  . Drug use: Not on file  . Sexual activity: Not on file  Lifestyle  . Physical activity:    Days per week: Not on file    Minutes per session: Not on file  . Stress: Not on file  Relationships  . Social connections:    Talks on phone: Not on file     Gets together: Not on file    Attends religious service: Not on file    Active member of club or organization: Not on file    Attends meetings of clubs or organizations: Not on file    Relationship status: Not on file  Other Topics Concern  . Not on file  Social History Narrative  . Not on file     Family History:  The patient's family history includes COPD in her father; Cancer in her maternal grandfather; Diabetes in her mother and sister; Heart attack in her father and mother; Heart attack (age of onset: 80) in her paternal grandfather; Heart disease in her father and mother.   Review of Systems:   Please see the history of present illness.     General:  No chills, fever, night sweats or weight changes.  Cardiovascular:  No dyspnea on exertion, edema, orthopnea, palpitations, paroxysmal nocturnal dyspnea. Positive for chest pain (now resolved).  Dermatological: No rash, lesions/masses Respiratory: No cough, dyspnea Urologic: No hematuria, dysuria Abdominal:   No nausea, vomiting, diarrhea, bright red blood per rectum, melena, or hematemesis Neurologic:  No visual changes, wkns, changes in mental status. All other systems reviewed and are otherwise negative except as noted above.   Physical Exam:    VS:  BP (!) 154/70   Pulse 65   Ht 5\' 2"  (1.575 m)   Wt 148 lb (67.1 kg)   SpO2 98%   BMI 27.07 kg/m    General: Well developed, well nourished Caucasian female appearing in no acute distress. Head: Normocephalic, atraumatic, sclera non-icteric, no xanthomas, nares are without discharge.  Neck: No carotid bruits. JVD not elevated.  Lungs: Respirations regular and unlabored, without wheezes or rales.  Heart: Regular rate and rhythm. No S3 or S4.  No murmur, no rubs, or gallops appreciated. Abdomen: Soft, non-tender, non-distended with normoactive bowel sounds. No hepatomegaly. No rebound/guarding. No obvious abdominal masses. Msk:  Strength and tone appear normal for age. No  joint deformities or effusions. Extremities: No clubbing or cyanosis. No lower extremity edema.  Distal pedal pulses are 2+ bilaterally. Neuro: Alert and oriented X 3. Moves all extremities spontaneously. No focal deficits noted. Psych:  Responds to questions appropriately with a normal affect. Skin: No rashes or lesions noted  Wt Readings from Last 3 Encounters:  08/30/17 148 lb (67.1 kg)  08/07/17 147 lb 6.4 oz (66.9 kg)  05/04/15 160 lb (72.6 kg)     Studies/Labs Reviewed:   EKG:  EKG is not ordered today.    Recent Labs: No results found for requested labs within last 8760 hours.   Lipid Panel No results found for: CHOL, TRIG, HDL, CHOLHDL, VLDL, LDLCALC, LDLDIRECT  Additional studies/ records that were reviewed today include:   NST: 08/09/2017  Blood pressure demonstrated a normal response to exercise.  There was no ST segment deviation noted during stress.  The study is normal.  There are no perfusion defects consistent with prior infarct or current ischemia.  This is a high risk study. Risk based on decreased LVEF. No evidence of active ischemia.  The left ventricular ejection fraction is severely decreased (<30%).    Assessment:    1. Chest pain, unspecified type   2. Bradycardia   3. Essential hypertension      Plan:   In order of problems listed above:  1. Chest Pain - was previously having episodes of chest pain with exertion and only lasting for a minute at a time. NST on 08/09/2017 showed no perfusion defects consistent with prior infarct or current ischemia. Her EF was read as being severely decreased at less than 30% therefore an echocardiogram was recommended for definitive assessment. This showed a preserved EF of 65-70% with no regional WMA. - she denies any recurrent chest pain. Is active at baseline and denies any recurrent anginal symptoms. No indication for further testing at this time.   2. Bradycardia - HR has improved into the 60's during  today's visit. Recent monitor showed no significant arrhythmias. Lopressor was recently discontinued. Continue to avoid AV nodal blocking agents.   3. HTN - BP elevated at 154/70 during today's visit. Reports this has been well-controlled in the ambulatory setting. Encouraged her to continue to follow this in the ambulatory setting.  - remains on Hydralazine 25mg  TID and Lisinopril-HCTZ 20-12.5 mg BID. Hydralazine can be further titrated if indicated.    Medication Adjustments/Labs and Tests Ordered: Current medicines are reviewed at length with the patient today.  Concerns regarding medicines are outlined above.  Medication changes, Labs and Tests ordered today are listed in the Patient Instructions below. Patient Instructions  Medication Instructions:  Your physician recommends that you continue on your current medications as directed. Please refer to the Current Medication list given to you today.   Labwork: NONE   Testing/Procedures: NONE   Follow-Up: Your physician wants you to follow-up in: 1 Year.  You will receive a reminder letter in the mail two months in advance. If you don't receive a letter, please call our office to schedule the follow-up appointment.   Any Other Special Instructions Will Be Listed Below (If Applicable).  If you need a refill on your cardiac medications before your next appointment, please call your pharmacy.  Thank you for choosing [facility]!    Signed, Ellsworth Lennox, PA-C  08/30/2017 10:10 PM    [facility] [address] [place], [place] [address] Phone: [phone]

## 2017-08-30 NOTE — Patient Instructions (Signed)
Medication Instructions:  Your physician recommends that you continue on your current medications as directed. Please refer to the Current Medication list given to you today.   Labwork: NONE   Testing/Procedures: NONE   Follow-Up: Your physician wants you to follow-up in: 1 Year. You will receive a reminder letter in the mail two months in advance. If you don't receive a letter, please call our office to schedule the follow-up appointment.   Any Other Special Instructions Will Be Listed Below (If Applicable).     If you need a refill on your cardiac medications before your next appointment, please call your pharmacy.  Thank you for choosing [facility]!   

## 2017-09-04 ENCOUNTER — Telehealth: Payer: Self-pay | Admitting: *Deleted

## 2017-09-04 NOTE — Telephone Encounter (Signed)
Notes recorded by Lesle ChrisHill, Marline Morace G, LPN on 9/60/45408/13/2019 at 8:50 AM EDT Patient notified. Copy to pmd. ------  Notes recorded by Lesle ChrisHill, Bradey Luzier G, LPN on 9/8/11918/08/2017 at 5:34 PM EDT Voice mail not set up. ------  Notes recorded by Laqueta LindenKoneswaran, Suresh A, MD on 08/29/2017 at 8:16 AM EDT Predominantly in normal rhythm with no worrisome arrhythmias.

## 2017-09-06 DIAGNOSIS — H2511 Age-related nuclear cataract, right eye: Secondary | ICD-10-CM | POA: Diagnosis not present

## 2017-09-07 DIAGNOSIS — H2512 Age-related nuclear cataract, left eye: Secondary | ICD-10-CM | POA: Diagnosis not present

## 2017-11-09 ENCOUNTER — Other Ambulatory Visit: Payer: Self-pay

## 2017-11-09 NOTE — Patient Outreach (Signed)
[facility]) Care Management  11/09/2017  Katie Chan 1950-02-24 [id number]   Medication Adherence call to Katie Chan spoke with patient she was at work and wants a call back after 4:30 pm. patient is due on Lisinopril / HCTZ 20/12.5 mg. Katie Chan is showing past due under [employer].   Lillia Abed CPhT Pharmacy Technician [facility] Management Direct Dial [phone]  Fax [phone] [email or web address]

## 2017-12-05 DIAGNOSIS — K219 Gastro-esophageal reflux disease without esophagitis: Secondary | ICD-10-CM | POA: Diagnosis not present

## 2017-12-05 DIAGNOSIS — L209 Atopic dermatitis, unspecified: Secondary | ICD-10-CM | POA: Diagnosis not present

## 2017-12-05 DIAGNOSIS — I10 Essential (primary) hypertension: Secondary | ICD-10-CM | POA: Diagnosis not present

## 2017-12-05 DIAGNOSIS — R1110 Vomiting, unspecified: Secondary | ICD-10-CM | POA: Diagnosis not present

## 2018-05-02 DIAGNOSIS — R197 Diarrhea, unspecified: Secondary | ICD-10-CM | POA: Diagnosis not present

## 2018-05-08 DIAGNOSIS — N183 Chronic kidney disease, stage 3 (moderate): Secondary | ICD-10-CM | POA: Diagnosis not present

## 2018-05-08 DIAGNOSIS — I10 Essential (primary) hypertension: Secondary | ICD-10-CM | POA: Diagnosis not present

## 2018-05-08 DIAGNOSIS — E875 Hyperkalemia: Secondary | ICD-10-CM | POA: Diagnosis not present

## 2018-05-22 DIAGNOSIS — R197 Diarrhea, unspecified: Secondary | ICD-10-CM | POA: Diagnosis not present

## 2018-05-22 DIAGNOSIS — Z1389 Encounter for screening for other disorder: Secondary | ICD-10-CM | POA: Diagnosis not present

## 2018-05-23 DIAGNOSIS — R197 Diarrhea, unspecified: Secondary | ICD-10-CM | POA: Diagnosis not present

## 2018-06-03 DIAGNOSIS — R5383 Other fatigue: Secondary | ICD-10-CM | POA: Diagnosis not present

## 2018-06-03 DIAGNOSIS — I10 Essential (primary) hypertension: Secondary | ICD-10-CM | POA: Diagnosis not present

## 2018-10-15 DIAGNOSIS — I10 Essential (primary) hypertension: Secondary | ICD-10-CM | POA: Diagnosis not present

## 2018-10-15 DIAGNOSIS — M25562 Pain in left knee: Secondary | ICD-10-CM | POA: Diagnosis not present

## 2018-11-04 DIAGNOSIS — M25562 Pain in left knee: Secondary | ICD-10-CM | POA: Diagnosis not present

## 2018-11-04 DIAGNOSIS — N189 Chronic kidney disease, unspecified: Secondary | ICD-10-CM | POA: Diagnosis not present

## 2018-12-23 DIAGNOSIS — N189 Chronic kidney disease, unspecified: Secondary | ICD-10-CM | POA: Diagnosis not present

## 2018-12-23 DIAGNOSIS — I10 Essential (primary) hypertension: Secondary | ICD-10-CM | POA: Diagnosis not present

## 2019-01-20 DIAGNOSIS — I10 Essential (primary) hypertension: Secondary | ICD-10-CM | POA: Diagnosis not present

## 2019-01-23 DIAGNOSIS — K219 Gastro-esophageal reflux disease without esophagitis: Secondary | ICD-10-CM | POA: Diagnosis not present

## 2019-01-23 DIAGNOSIS — I10 Essential (primary) hypertension: Secondary | ICD-10-CM | POA: Diagnosis not present

## 2019-01-27 DIAGNOSIS — I10 Essential (primary) hypertension: Secondary | ICD-10-CM | POA: Diagnosis not present

## 2019-01-27 DIAGNOSIS — G629 Polyneuropathy, unspecified: Secondary | ICD-10-CM | POA: Diagnosis not present

## 2019-02-19 DIAGNOSIS — M25562 Pain in left knee: Secondary | ICD-10-CM | POA: Diagnosis not present

## 2019-02-19 DIAGNOSIS — Z0000 Encounter for general adult medical examination without abnormal findings: Secondary | ICD-10-CM | POA: Diagnosis not present

## 2019-02-19 DIAGNOSIS — G629 Polyneuropathy, unspecified: Secondary | ICD-10-CM | POA: Diagnosis not present

## 2019-02-19 DIAGNOSIS — I10 Essential (primary) hypertension: Secondary | ICD-10-CM | POA: Diagnosis not present

## 2019-02-19 DIAGNOSIS — M2550 Pain in unspecified joint: Secondary | ICD-10-CM | POA: Diagnosis not present

## 2019-02-21 DIAGNOSIS — K219 Gastro-esophageal reflux disease without esophagitis: Secondary | ICD-10-CM | POA: Diagnosis not present

## 2019-02-21 DIAGNOSIS — I10 Essential (primary) hypertension: Secondary | ICD-10-CM | POA: Diagnosis not present

## 2019-03-21 DIAGNOSIS — I10 Essential (primary) hypertension: Secondary | ICD-10-CM | POA: Diagnosis not present

## 2019-04-22 DIAGNOSIS — I10 Essential (primary) hypertension: Secondary | ICD-10-CM | POA: Diagnosis not present

## 2019-04-22 DIAGNOSIS — E7849 Other hyperlipidemia: Secondary | ICD-10-CM | POA: Diagnosis not present

## 2019-04-23 DIAGNOSIS — E7849 Other hyperlipidemia: Secondary | ICD-10-CM | POA: Diagnosis not present

## 2019-04-23 DIAGNOSIS — I10 Essential (primary) hypertension: Secondary | ICD-10-CM | POA: Diagnosis not present

## 2019-05-22 DIAGNOSIS — N189 Chronic kidney disease, unspecified: Secondary | ICD-10-CM | POA: Diagnosis not present

## 2019-05-22 DIAGNOSIS — I120 Hypertensive chronic kidney disease with stage 5 chronic kidney disease or end stage renal disease: Secondary | ICD-10-CM | POA: Diagnosis not present

## 2019-05-23 DIAGNOSIS — I120 Hypertensive chronic kidney disease with stage 5 chronic kidney disease or end stage renal disease: Secondary | ICD-10-CM | POA: Diagnosis not present

## 2019-05-23 DIAGNOSIS — N189 Chronic kidney disease, unspecified: Secondary | ICD-10-CM | POA: Diagnosis not present

## 2019-06-23 DIAGNOSIS — I129 Hypertensive chronic kidney disease with stage 1 through stage 4 chronic kidney disease, or unspecified chronic kidney disease: Secondary | ICD-10-CM | POA: Diagnosis not present

## 2019-06-23 DIAGNOSIS — K219 Gastro-esophageal reflux disease without esophagitis: Secondary | ICD-10-CM | POA: Diagnosis not present

## 2019-06-23 DIAGNOSIS — N183 Chronic kidney disease, stage 3 unspecified: Secondary | ICD-10-CM | POA: Diagnosis not present

## 2019-06-23 DIAGNOSIS — E875 Hyperkalemia: Secondary | ICD-10-CM | POA: Diagnosis not present

## 2019-07-10 DIAGNOSIS — N183 Chronic kidney disease, stage 3 unspecified: Secondary | ICD-10-CM | POA: Diagnosis not present

## 2019-07-10 DIAGNOSIS — I129 Hypertensive chronic kidney disease with stage 1 through stage 4 chronic kidney disease, or unspecified chronic kidney disease: Secondary | ICD-10-CM | POA: Diagnosis not present

## 2019-07-15 DIAGNOSIS — N390 Urinary tract infection, site not specified: Secondary | ICD-10-CM | POA: Diagnosis not present

## 2019-07-15 DIAGNOSIS — R0989 Other specified symptoms and signs involving the circulatory and respiratory systems: Secondary | ICD-10-CM | POA: Diagnosis not present

## 2019-07-15 DIAGNOSIS — R42 Dizziness and giddiness: Secondary | ICD-10-CM | POA: Diagnosis not present

## 2019-07-23 DIAGNOSIS — N183 Chronic kidney disease, stage 3 unspecified: Secondary | ICD-10-CM | POA: Diagnosis not present

## 2019-07-23 DIAGNOSIS — I129 Hypertensive chronic kidney disease with stage 1 through stage 4 chronic kidney disease, or unspecified chronic kidney disease: Secondary | ICD-10-CM | POA: Diagnosis not present

## 2019-07-23 DIAGNOSIS — K219 Gastro-esophageal reflux disease without esophagitis: Secondary | ICD-10-CM | POA: Diagnosis not present

## 2019-07-23 DIAGNOSIS — E875 Hyperkalemia: Secondary | ICD-10-CM | POA: Diagnosis not present

## 2019-07-31 DIAGNOSIS — R42 Dizziness and giddiness: Secondary | ICD-10-CM | POA: Diagnosis not present

## 2019-07-31 DIAGNOSIS — R5383 Other fatigue: Secondary | ICD-10-CM | POA: Diagnosis not present

## 2019-07-31 DIAGNOSIS — N390 Urinary tract infection, site not specified: Secondary | ICD-10-CM | POA: Diagnosis not present

## 2019-07-31 DIAGNOSIS — R0989 Other specified symptoms and signs involving the circulatory and respiratory systems: Secondary | ICD-10-CM | POA: Diagnosis not present

## 2019-07-31 DIAGNOSIS — M545 Low back pain: Secondary | ICD-10-CM | POA: Diagnosis not present

## 2019-08-06 NOTE — Progress Notes (Addendum)
Cardiology Office Note  Date: 08/07/2019   ID: Katie Chan, DOB 1950-01-26, MRN [id number]  PCP:  Lawerance Sabal, PA  Cardiologist:  [name], MD Electrophysiologist:  None   Chief Complaint: Follow-up chest pain  History of Present Illness: Katie Chan is a 69 y.o. female with a history of chest pain, bradycardia, HTN.  Previously seen by Purvis Sheffield 08/07/2017 for frequent episodes of chest discomfort.  Reported having retrosternal chest discomfort with associated fatigue over the past several months typically lasting for less than a minute then spontaneously resolving.  EKG showed no acute ischemic changes.  Nuclear stress test on 08/09/2017 was normal.  Calculated EF on stress test was less than 30%.  Echocardiogram was performed showed EF of 65 to 70% with no R WMA's, trivial AI and trivial TR.  Event monitor: Normal sinus rhythm with some episodes of sinus tachycardia but no significant arrhythmias.  Seen by Randall An, PA 08/30/2017 and reported overall doing well.  Denied any repeated episodes of chest discomfort.  Previously had issues with bradycardia.  Metoprolol was stopped by PCP.  She was switched to hydralazine 25 mg 3 times daily.  Heart rate was in the 60s during that visit.  Recently seen by Darden Dates, NP who discovered a right carotid bruit.  She presents today with complaints of increasing dyspnea on exertion, increasing chest pressure, fatigue, lower extremity swelling.  She states when she walks her dogs she has increasing chest pressure.  She rates the chest pressure at 6 out of 10 in severity lasting a few minutes until she stops her activity then resolves.  States she has issues with increasing fatigue.  Recently saw PCP who heard a carotid bruit on the right side.  She is on 4 separate blood pressure medications including; hydralazine 25 mg p.o. 3 times daily, amlodipine 10 mg daily, HCTZ 25 mg daily, lisinopril 40 mg daily.  Patient states  she believes this is too much and recently was told she was dehydrated and needed to drink more water.  States she had recent lab work from PCP.  We do not have those results.  States she had to take a week off because she just feels significantly fatigued with accompanying shortness of breath and chest tightness.   Past Medical History:  Diagnosis Date  . Fracture of radius, distal, right, closed 05/04/2015  . GERD (gastroesophageal reflux disease)   . Hypertension     Past Surgical History:  Procedure Laterality Date  . OPEN REDUCTION INTERNAL FIXATION (ORIF) DISTAL RADIAL FRACTURE Right 05/04/2015   Procedure: OPEN REDUCTION INTERNAL FIXATION (ORIF) RIGHT DISTAL RADIUS FRACTURE;  Surgeon: Teryl Lucy, MD;  Location: [facility];  Service: Orthopedics;  Laterality: Right;  . TONSILLECTOMY AND ADENOIDECTOMY      Current Outpatient Medications  Medication Sig Dispense Refill  . gabapentin (NEURONTIN) 300 MG capsule Take 300 mg by mouth at bedtime.    . hydrALAZINE (APRESOLINE) 25 MG tablet Take 25 mg by mouth 3 (three) times daily.  0  . lisinopril (ZESTRIL) 40 MG tablet Take 1 tablet by mouth daily.    Marland Kitchen omeprazole (PRILOSEC) 40 MG capsule Take 1 capsule by mouth every morning.     No current facility-administered medications for this visit.   Allergies:  Patient has no known allergies.   Social History: The patient  reports that she has never smoked. She has never used smokeless tobacco.   Family History: The patient's family history includes COPD in  her father; Cancer in her maternal grandfather; Diabetes in her mother and sister; Heart attack in her father and mother; Heart attack (age of onset: 66) in her paternal grandfather; Heart disease in her father and mother.   ROS:  Please see the history of present illness. Otherwise, complete review of systems is positive for none.  All other systems are reviewed and negative.   Physical Exam: VS:  BP 131/69   Pulse 60    Ht 5\' 2"  (1.575 m)   Wt 144 lb 12.8 oz (65.7 kg)   BMI 26.48 kg/m , BMI Body mass index is 26.48 kg/m.  Wt Readings from Last 3 Encounters:  08/07/19 144 lb 12.8 oz (65.7 kg)  08/30/17 148 lb (67.1 kg)  08/07/17 147 lb 6.4 oz (66.9 kg)    General: Patient appears comfortable at rest. Neck: Supple, no elevated JVP , right carotid bruit,  no thyromegaly. Lungs: Clear to auscultation, nonlabored breathing at rest. Cardiac: Regular rate and rhythm, no S3 or significant systolic murmur, no pericardial rub. Extremities: No pitting edema, distal pulses 2+. Skin: Warm and dry. Musculoskeletal: No kyphosis. Neuropsychiatric: Alert and oriented x3, affect grossly appropriate.  ECG:  An ECG dated 08/07/2019 was personally reviewed today and demonstrated:  Sinus bradycardia with a rate of 55.  Recent Labwork: No results found for requested labs within last 8760 hours.  No results found for: CHOL, TRIG, HDL, CHOLHDL, VLDL, LDLCALC, LDLDIRECT  Other Studies Reviewed Today:  Echocardiogram 08/14/2017  Study Conclusions   - Left ventricle: The cavity size was normal. Wall thickness was  normal. Systolic function was vigorous. The estimated ejection  fraction was in the range of 65% to 70%. Wall motion was normal;  there were no regional wall motion abnormalities. Left  ventricular diastolic function parameters were normal.  - Aortic valve: Mildly calcified annulus. Trileaflet. There was  trivial regurgitation.  - Mitral valve: There was trivial regurgitation.  - Right atrium: Central venous pressure (est): 3 mm Hg.  - Atrial septum: No defect or patent foramen ovale was identified.  - Tricuspid valve: There was trivial regurgitation.  - Pulmonary arteries: PA peak pressure: 23 mm Hg (S).  - Pericardium, extracardiac: There was no pericardial effusion.   Cardiac telemetry monitoring 08/13/2017. Study Highlights   Sinus rhythm and sinus tachycardia. No significant  arrhythmias.    NST: 08/09/2017  Blood pressure demonstrated a normal response to exercise.  There was no ST segment deviation noted during stress.  The study is normal. There are no perfusion defects consistent with prior infarct or current ischemia.  This is a high risk study. Risk based on decreased LVEF. No evidence of active ischemia.  The left ventricular ejection fraction is severely decreased (<30%).   Assessment and Plan:  1. Chest pain, unspecified type   2. Bradycardia   3. Essential hypertension   4. Hx of dizziness   5. Bruit of right carotid artery   6. SOB (shortness of breath)    1. Chest pain, unspecified type Describes chest pressure/chest tightness when performing exertional activities such as walking her dog or when performing activities at work.  Get a repeat nuclear Lexiscan stress test.  2. Bradycardia History of bradycardia in the past.  Beta-blocker metoprolol was stopped by PCP.  EKG today shows sinus bradycardia with a rate of 55.  Patient is on no AV nodal blocking agents other than amlodipine.  3. Essential hypertension She is normotensive today with a blood pressure of 131/69.  She  is on 4 separate antihypertensive medications including amlodipine 10 mg daily, hydralazine 25 mg 3 times daily, lisinopril 40 mg daily.  HCTZ 25 mg daily.  States she feels extremely fatigued and was told she was dehydrated at PCP office and needed to drink more water.  4. Hx of dizziness Patient states she has episodes of dizziness but no frank presyncopal or syncopal episodes.  States when she gets up in the morning she feels a little unbalanced.  5.  Right carotid artery bruit PCP heard a recent right carotid artery bruit on exam.  Get carotid artery duplex exam  6.  Increasing dyspnea on exertion/shortness of breath Patient complains of increased dyspnea on exertion with exertional fatigue.  Please get a repeat echocardiogram.  7.  Fatigue. Patient states she  is having significant fatigue and unable to perform activities at work.  States she has taken a week off due to significant increase in fatigue.  We will attempt to obtain recent lab work from PCP.   Medication Adjustments/Labs and Tests Ordered: Current medicines are reviewed at length with the patient today.  Concerns regarding medicines are outlined above.   Disposition: Follow-up with Dr. Wyline Mood or APP 1 month.  Signed, Rennis Harding, NP 08/07/2019 8:56 AM    [facility] [address] [place], [place], [place] [address] Phone: [phone]; Fax: [phone]

## 2019-08-07 ENCOUNTER — Ambulatory Visit (INDEPENDENT_AMBULATORY_CARE_PROVIDER_SITE_OTHER): Payer: Medicare Other | Admitting: Family Medicine

## 2019-08-07 ENCOUNTER — Encounter: Payer: Self-pay | Admitting: *Deleted

## 2019-08-07 ENCOUNTER — Telehealth: Payer: Self-pay | Admitting: *Deleted

## 2019-08-07 ENCOUNTER — Encounter: Payer: Self-pay | Admitting: Family Medicine

## 2019-08-07 VITALS — BP 131/69 | HR 60 | Ht 62.0 in | Wt 144.8 lb

## 2019-08-07 DIAGNOSIS — R0602 Shortness of breath: Secondary | ICD-10-CM

## 2019-08-07 DIAGNOSIS — I10 Essential (primary) hypertension: Secondary | ICD-10-CM

## 2019-08-07 DIAGNOSIS — R0989 Other specified symptoms and signs involving the circulatory and respiratory systems: Secondary | ICD-10-CM

## 2019-08-07 DIAGNOSIS — Z87898 Personal history of other specified conditions: Secondary | ICD-10-CM | POA: Diagnosis not present

## 2019-08-07 DIAGNOSIS — R5383 Other fatigue: Secondary | ICD-10-CM

## 2019-08-07 DIAGNOSIS — R001 Bradycardia, unspecified: Secondary | ICD-10-CM

## 2019-08-07 DIAGNOSIS — R079 Chest pain, unspecified: Secondary | ICD-10-CM | POA: Diagnosis not present

## 2019-08-07 NOTE — Patient Instructions (Addendum)
Medication Instructions:   Your physician recommends that you continue on your current medications as directed. Please refer to the Current Medication list given to you today.  Labwork:  NONE  Testing/Procedures: Your physician has requested that you have a lexiscan myoview. For further information please visit [email or web address] Please follow instruction sheet, as given. Your physician has requested that you have an echocardiogram. Echocardiography is a painless test that uses sound waves to create images of your heart. It provides your doctor with information about the size and shape of your heart and how well your heart's chambers and valves are working. This procedure takes approximately one hour. There are no restrictions for this procedure. Your physician has requested that you have a carotid duplex. This test is an ultrasound of the carotid arteries in your neck. It looks at blood flow through these arteries that supply the brain with blood. Allow one hour for this exam. There are no restrictions or special instructions.  Follow-Up:  Your physician recommends that you schedule a follow-up appointment in: 4 weeks (office).  Any Other Special Instructions Will Be Listed Below (If Applicable).  If you need a refill on your cardiac medications before your next appointment, please call your pharmacy.

## 2019-08-07 NOTE — Telephone Encounter (Signed)
[facility]stream contacted to verify patient taking amlodipine, hctz and mobic. All medications added to med list to update profile.

## 2019-08-14 ENCOUNTER — Encounter (HOSPITAL_COMMUNITY): Payer: Self-pay

## 2019-08-14 ENCOUNTER — Encounter (HOSPITAL_COMMUNITY)
Admission: RE | Admit: 2019-08-14 | Discharge: 2019-08-14 | Disposition: A | Discharge status: Home / Self Care | Payer: Medicare Other | Source: Ambulatory Visit | Attending: Family Medicine | Admitting: Family Medicine

## 2019-08-14 ENCOUNTER — Ambulatory Visit (HOSPITAL_COMMUNITY)
Admission: RE | Admit: 2019-08-14 | Discharge: 2019-08-14 | Disposition: A | Discharge status: Home / Self Care | Payer: Medicare Other | Source: Ambulatory Visit | Attending: Family Medicine | Admitting: Family Medicine

## 2019-08-14 ENCOUNTER — Other Ambulatory Visit: Payer: Self-pay

## 2019-08-14 ENCOUNTER — Ambulatory Visit (HOSPITAL_BASED_OUTPATIENT_CLINIC_OR_DEPARTMENT_OTHER)
Admission: RE | Admit: 2019-08-14 | Discharge: 2019-08-14 | Disposition: A | Discharge status: Home / Self Care | Payer: Medicare Other | Source: Ambulatory Visit | Attending: Family Medicine | Admitting: Family Medicine

## 2019-08-14 ENCOUNTER — Ambulatory Visit (HOSPITAL_COMMUNITY): Admission: RE | Admit: 2019-08-14 | Payer: Medicare Other | Source: Ambulatory Visit

## 2019-08-14 DIAGNOSIS — R079 Chest pain, unspecified: Secondary | ICD-10-CM | POA: Insufficient documentation

## 2019-08-14 DIAGNOSIS — R0602 Shortness of breath: Secondary | ICD-10-CM

## 2019-08-14 LAB — ECHOCARDIOGRAM COMPLETE
AR max vel: 1.2 cm2
AV Area VTI: 1.4 cm2
AV Area mean vel: 1.41 cm2
AV Mean grad: 5.5 mmHg
AV Peak grad: 13.8 mmHg
Ao pk vel: 1.86 m/s
Area-P 1/2: 2.34 cm2
MV M vel: 5.69 m/s
MV Peak grad: 129.5 mmHg
S' Lateral: 2.33 cm

## 2019-08-14 LAB — NM MYOCAR MULTI W/SPECT W/WALL MOTION / EF
LV dias vol: 92 mL (ref 46–106)
LV sys vol: 32 mL
Peak HR: 96 {beats}/min
RATE: 0.54
Rest HR: 42 {beats}/min
SDS: 1
SRS: 0
SSS: 1
TID: 1.05

## 2019-08-14 MED ORDER — SODIUM CHLORIDE FLUSH 0.9 % IV SOLN
INTRAVENOUS | Status: AC
  Administered 2019-08-14: 10 mL via INTRAVENOUS
  Filled 2019-08-14: qty 10

## 2019-08-14 MED ORDER — TECHNETIUM TC 99M TETROFOSMIN IV KIT
30.0000 | PACK | Freq: Once | INTRAVENOUS | Status: AC | PRN
  Administered 2019-08-14: 31 via INTRAVENOUS

## 2019-08-14 MED ORDER — REGADENOSON 0.4 MG/5ML IV SOLN
INTRAVENOUS | Status: AC
  Administered 2019-08-14: 0.4 mg via INTRAVENOUS
  Filled 2019-08-14: qty 5

## 2019-08-14 MED ORDER — TECHNETIUM TC 99M TETROFOSMIN IV KIT
10.0000 | PACK | Freq: Once | INTRAVENOUS | Status: AC | PRN
  Administered 2019-08-14: 10.1 via INTRAVENOUS

## 2019-08-14 NOTE — Progress Notes (Signed)
*  PRELIMINARY RESULTS* Echocardiogram 2D Echocardiogram has been performed.  Jeryl Columbia 08/14/2019, 1:50 PM

## 2019-08-15 ENCOUNTER — Telehealth: Payer: Self-pay | Admitting: *Deleted

## 2019-08-15 NOTE — Telephone Encounter (Signed)
-----   Message from Netta Neat., NP sent at 08/14/2019  1:51 PM EDT ----- Regarding: Mzio Monitor 14 day. Patient recently had a stress test and was having symptoms of near syncope per Theodore Demark, PA.  She had a slow heart rate of 41.  Please order a 14-day ZIO monitor.  Thank you

## 2019-08-15 NOTE — Telephone Encounter (Signed)
Left message to return call 

## 2019-08-20 ENCOUNTER — Telehealth: Payer: Self-pay | Admitting: *Deleted

## 2019-08-20 DIAGNOSIS — R55 Syncope and collapse: Secondary | ICD-10-CM

## 2019-08-20 DIAGNOSIS — R001 Bradycardia, unspecified: Secondary | ICD-10-CM

## 2019-08-20 NOTE — Telephone Encounter (Signed)
-----   Message from Netta Neat., NP sent at 08/15/2019  6:34 AM EDT ----- Please call the patient and let her know the echocardiogram showed good pumping function of her heart. Her left ventricle is mildly thick and muscular.Best treatment for this is managing BP. She had a very mildly leaking mitral valve. Thanks

## 2019-08-21 NOTE — Telephone Encounter (Signed)
Returned call to pt. And notified of test results. Pt questioning why HR is so low and would like to know what can be done about it?

## 2019-08-21 NOTE — Telephone Encounter (Signed)
Did she mention whether she was symptomatic with the low heart rate. She is not on any medications that would slow her heart rate down. She on amlodipine but this usually does not slow the heart rate like the nondihydropyridine calcium channel blockers like Cardizem or verapamil. If she is symptomatic that is one thing but if it is just a slow heart rate without any symptoms that is okay. Just let her know if she is symptomatic with this low heart rate to go to the emergency room. If she is not symptomatic then everything should be okay. Thanks

## 2019-08-21 NOTE — Telephone Encounter (Signed)
Patient returned call to [facility]. In regards to test results.

## 2019-08-21 NOTE — Telephone Encounter (Signed)
Pt reports that she still feels weak and has SOB at times.

## 2019-08-22 ENCOUNTER — Ambulatory Visit (INDEPENDENT_AMBULATORY_CARE_PROVIDER_SITE_OTHER): Payer: Medicare Other

## 2019-08-22 ENCOUNTER — Ambulatory Visit (INDEPENDENT_AMBULATORY_CARE_PROVIDER_SITE_OTHER): Payer: Medicare Other | Admitting: *Deleted

## 2019-08-22 DIAGNOSIS — R55 Syncope and collapse: Secondary | ICD-10-CM

## 2019-08-22 DIAGNOSIS — R001 Bradycardia, unspecified: Secondary | ICD-10-CM | POA: Diagnosis not present

## 2019-08-22 DIAGNOSIS — I129 Hypertensive chronic kidney disease with stage 1 through stage 4 chronic kidney disease, or unspecified chronic kidney disease: Secondary | ICD-10-CM | POA: Diagnosis not present

## 2019-08-22 DIAGNOSIS — K219 Gastro-esophageal reflux disease without esophagitis: Secondary | ICD-10-CM | POA: Diagnosis not present

## 2019-08-22 DIAGNOSIS — E875 Hyperkalemia: Secondary | ICD-10-CM | POA: Diagnosis not present

## 2019-08-22 DIAGNOSIS — N183 Chronic kidney disease, stage 3 unspecified: Secondary | ICD-10-CM | POA: Diagnosis not present

## 2019-08-22 NOTE — Telephone Encounter (Signed)
14 Day ZIO AT was ordered o 08/15/19-patient wasn't aware Advised to come to office today to have monitor placed Says she will come when she gets off work today at 4:00 pm. ZIO enrolled with Dr. Wyline Mood who's DOD today.

## 2019-08-22 NOTE — Telephone Encounter (Signed)
Patient informed of test results and verbalized understanding of plan.  14 Day ZIO AT was ordered o 08/15/19-patient wasn't aware Advised to come to office today to have monitor placed Says she will come when she gets off work today at 4:00 pm. ZIO enrolled with Dr. Wyline Mood who's DOD today.

## 2019-08-23 DIAGNOSIS — R55 Syncope and collapse: Secondary | ICD-10-CM | POA: Diagnosis not present

## 2019-08-25 DIAGNOSIS — Z1231 Encounter for screening mammogram for malignant neoplasm of breast: Secondary | ICD-10-CM | POA: Diagnosis not present

## 2019-08-25 NOTE — Progress Notes (Signed)
14 Day ZIO placed

## 2019-09-04 ENCOUNTER — Ambulatory Visit: Payer: Medicare Other | Admitting: Family Medicine

## 2019-09-17 NOTE — Progress Notes (Addendum)
Cardiology Office Note  Date: 09/18/2019   ID: Katie Chan, DOB 08/01/1950, MRN [id number]  PCP:  Lawerance Sabal, PA  Cardiologist:  No primary care provider on file. Electrophysiologist:  None   Chief Complaint: Follow-up chest pain  History of Present Illness: Katie Chan is a 69 y.o. female with a history of chest pain, bradycardia, HTN.  Previously seen by Purvis Sheffield 08/07/2017 for frequent episodes of chest discomfort.  Reported having retrosternal chest discomfort with associated fatigue over the past several months typically lasting for less than a minute then spontaneously resolving.  EKG showed no acute ischemic changes.  Nuclear stress test on 08/09/2017 was normal.  Calculated EF on stress test was less than 30%.  Echocardiogram was performed showed EF of 65 to 70% with no R WMA's, trivial AI and trivial TR.  Event monitor: Normal sinus rhythm with some episodes of sinus tachycardia but no significant arrhythmias.  Seen by Randall An, PA 08/30/2017 and reported overall doing well.  Denied any repeated episodes of chest discomfort.  Previously had issues with bradycardia.  Metoprolol was stopped by PCP.  She was switched to hydralazine 25 mg 3 times daily.  Heart rate was in the 60s during that visit.  Recently seen by Darden Dates, NP who discovered a right carotid bruit.  Presented last visit with complaints of increasing dyspnea on exertion, increasing chest pressure, fatigue, lower extremity swelling.  She stated when she walks her dogs she has increasing chest pressure.  She rates the chest pressure at 6 out of 10 in severity lasting a few minutes until she stops her activity then resolves.  States she has issues with increasing fatigue.  Recently saw PCP who heard a carotid bruit on the right side.  She is on 4 separate blood pressure medications including; hydralazine 25 mg p.o. 3 times daily, amlodipine 10 mg daily, HCTZ 25 mg daily, lisinopril 40 mg daily.   Patient states she believes this is too much and recently was told she was dehydrated and needed to drink more water.  States she had recent lab work from PCP.  We do not have those results.  States she had to take a week off because she just feels significantly fatigued with accompanying shortness of breath and chest tightness.  She is here for follow-up today.  Results of stress test, cardiac monitor and echo results reviewed with patient.  She verbalizes understanding.  Her PCP just recently decreased her lisinopril to half a pill to 20 mg daily.  Today blood pressure is elevated at 170/74.  Her only complaint today is feeling tired all the time she states she did not start feeling tired until after a few months back she had a spell where she felt really bad for approximately 3 days with some shortness of breath.  She states since that time she has felt tired all of the time.  She denies any anginal symptoms, orthostatic symptoms, CVA or TIA-like symptoms, PND, orthopnea, lower extremity edema.   Past Medical History:  Diagnosis Date  . Fracture of radius, distal, right, closed 05/04/2015  . GERD (gastroesophageal reflux disease)   . Hypertension     Past Surgical History:  Procedure Laterality Date  . OPEN REDUCTION INTERNAL FIXATION (ORIF) DISTAL RADIAL FRACTURE Right 05/04/2015   Procedure: OPEN REDUCTION INTERNAL FIXATION (ORIF) RIGHT DISTAL RADIUS FRACTURE;  Surgeon: Teryl Lucy, MD;  Location: [facility];  Service: Orthopedics;  Laterality: Right;  . TONSILLECTOMY AND ADENOIDECTOMY  Current Outpatient Medications  Medication Sig Dispense Refill  . amLODipine (NORVASC) 10 MG tablet Take 10 mg by mouth every morning.    . gabapentin (NEURONTIN) 300 MG capsule Take 300 mg by mouth at bedtime.    . hydrALAZINE (APRESOLINE) 25 MG tablet Take 25 mg by mouth 3 (three) times daily.  0  . hydrochlorothiazide (HYDRODIURIL) 25 MG tablet Take 25 mg by mouth every morning.      Marland Kitchen lisinopril (ZESTRIL) 40 MG tablet Take 0.5 tablets by mouth daily. Decreased by pcp 2-3 wks ago.    Marland Kitchen omeprazole (PRILOSEC) 40 MG capsule Take 1 capsule by mouth every morning.    . cloNIDine (CATAPRES) 0.1 MG tablet Take 1 tablet (0.1 mg total) by mouth 2 (two) times daily. 60 tablet 6   No current facility-administered medications for this visit.   Allergies:  Patient has no known allergies.   Social History: The patient  reports that she has never smoked. She has never used smokeless tobacco.   Family History: The patient's family history includes COPD in her father; Cancer in her maternal grandfather; Diabetes in her mother and sister; Heart attack in her father and mother; Heart attack (age of onset: 74) in her paternal grandfather; Heart disease in her father and mother.   ROS:  Please see the history of present illness. Otherwise, complete review of systems is positive for none.  All other systems are reviewed and negative.   Physical Exam: VS:  BP (!) 170/74 Comment: has taken am meds  Pulse (!) 53   Ht 5\' 2"  (1.575 m)   Wt 144 lb (65.3 kg)   SpO2 96%   BMI 26.34 kg/m , BMI Body mass index is 26.34 kg/m.  Wt Readings from Last 3 Encounters:  09/18/19 144 lb (65.3 kg)  08/07/19 144 lb 12.8 oz (65.7 kg)  08/30/17 148 lb (67.1 kg)    General: Patient appears comfortable at rest. Neck: Supple, no elevated JVP , right carotid bruit,  no thyromegaly. Lungs: Clear to auscultation, nonlabored breathing at rest. Cardiac: Regular rate and rhythm, no S3 or significant systolic murmur, no pericardial rub. Extremities: No pitting edema, distal pulses 2+. Skin: Warm and dry. Musculoskeletal: No kyphosis. Neuropsychiatric: Alert and oriented x3, affect grossly appropriate.  ECG:  An ECG dated 08/07/2019 was personally reviewed today and demonstrated:  Sinus bradycardia with a rate of 55.  Recent Labwork: No results found for requested labs within last 8760 hours.  No results  found for: CHOL, TRIG, HDL, CHOLHDL, VLDL, LDLCALC, LDLDIRECT  Other Studies Reviewed Today:  NST, 08/14/2019 Study Result  Narrative & Impression   There was no ST segment deviation noted during stress.  This is a low risk study.  The left ventricular ejection fraction is hyperdynamic (>65%).  Small mild intensity apical defect with mild reversibility likely secondary to apical thinning, cannot completely exclude mild apical ischemia. Either finding would support low risk.      Long term monitor 08/22/2019 Patient had a min HR of 24 bpm, max HR of 182 bpm, and avg HR of 61 bpm. Predominant underlying rhythm was Sinus Rhythm. 6 Supraventricular Tachycardia runs occurred, the run with the fastest interval lasting 7 beats with a max rate of 182 bpm, the longest lasting 4 beats with an avg rate of 101 bpm. 1 episode(s) of AV Block (2nd Mobitz II) occurred, lasting a total of 6 secs. Isolated SVEs were rare (<1.0%), SVE Couplets were rare (<1.0%), and SVE Triplets were rare (<  1.0%). Isolated VEs were rare (<1.0%), VE Couplets were rare (<1.0%), and no VE Triplets were present.  Echocardiogram 08/14/2019  1. Left ventricular ejection fraction, by estimation, is 65 to 70%. The left ventricle has normal function. The left ventricle has no regional wall motion abnormalities. There is mild left ventricular hypertrophy. Left ventricular diastolic parameters were normal. 2. Right ventricular systolic function is normal. The right ventricular size is normal. There is normal pulmonary artery systolic pressure. 3. The mitral valve is normal in structure. Trivial mitral valve regurgitation. No evidence of mitral stenosis. 4. The aortic valve is tricuspid. Aortic valve regurgitation is not visualized. No aortic stenosis is present. 5. The inferior vena cava is normal in size with greater than 50% respiratory variability, suggesting right atrial pressure of 3 mmHg.   Echocardiogram  08/14/2017  Study Conclusions   - Left ventricle: The cavity size was normal. Wall thickness was  normal. Systolic function was vigorous. The estimated ejection  fraction was in the range of 65% to 70%. Wall motion was normal;  there were no regional wall motion abnormalities. Left  ventricular diastolic function parameters were normal.  - Aortic valve: Mildly calcified annulus. Trileaflet. There was  trivial regurgitation.  - Mitral valve: There was trivial regurgitation.  - Right atrium: Central venous pressure (est): 3 mm Hg.  - Atrial septum: No defect or patent foramen ovale was identified.  - Tricuspid valve: There was trivial regurgitation.  - Pulmonary arteries: PA peak pressure: 23 mm Hg (S).  - Pericardium, extracardiac: There was no pericardial effusion.   Cardiac telemetry monitoring 08/13/2017. Study Highlights   Sinus rhythm and sinus tachycardia. No significant arrhythmias.    NST: 08/09/2017  Blood pressure demonstrated a normal response to exercise.  There was no ST segment deviation noted during stress.  The study is normal. There are no perfusion defects consistent with prior infarct or current ischemia.  This is a high risk study. Risk based on decreased LVEF. No evidence of active ischemia.  The left ventricular ejection fraction is severely decreased (<30%).   Assessment and Plan:   1. Chest pain, unspecified type Described chest pressure/chest tightness when performing exertional activities such as walking her dog or when performing activities at work at last visit.  No complaints of chest pain/pressure today. Lexiscan Myoview showed small mild intensity apical defect with mild reversibility likely secondary to apical thinning.  Cannot completely exclude mild ischemia.  Either finding with support low risk..  2. Bradycardia History of bradycardia in the past.  Beta-blocker metoprolol was stopped by PCP.  EKG today shows sinus bradycardia  with a rate of 55.  Patient is on no AV nodal blocking agents other than amlodipine.  3. Essential hypertension Blood pressure is elevated today 170/74.  She is on 4 separate antihypertensive medications including amlodipine 10 mg daily, hydralazine 25 mg 3 times daily, HCTZ 25 mg daily and lisinopril 20 mg.  Recently PCP decreased lisinopril from 40 mg to 20 mg daily.  Start clonidine 0.1 mg p.o. twice daily for elevated blood pressure.  4. Hx of dizziness Patient states she has episodes of dizziness but no frank presyncopal or syncopal episodes.  States when she gets up in the morning she feels a little unbalanced.  5.  Right carotid artery bruit PCP heard a recent right carotid artery bruit on exam.  Get carotid artery duplex exam  6.  Increasing dyspnea on exertion/shortness of breath Patient complains of increased dyspnea on exertion with exertional fatigue. Echocardiogram  08/14/2019 showed EF 65 to 70%.,  Mild LVH, trivial MR  7.  Fatigue. At last visit patient stated she was having significant fatigue and unable to perform activities at work.  She stated she had taken a week off due to significant increase in fatigue. Recent labs from PCP office showed glucose of 66, BUN 24, creatinine 1.28, GFR 43, potassium 5.3, hemoglobin 11.1, hematocrit 34.8.  Get a TSH, T3, T4, vitamin B12, and vitamin D.   Medication Adjustments/Labs and Tests Ordered: Current medicines are reviewed at length with the patient today.  Concerns regarding medicines are outlined above.   Disposition: Follow-up with Dr. Wyline MoodBranch or APP 1 month.  Signed, Rennis HardingAndrew Quinn, NP 09/18/2019 11:13 AM    [facility] [address], [place], [place] [address] Phone: [phone]; Fax: [phone]

## 2019-09-18 ENCOUNTER — Encounter: Payer: Self-pay | Admitting: Family Medicine

## 2019-09-18 ENCOUNTER — Ambulatory Visit: Payer: Medicare Other | Admitting: Family Medicine

## 2019-09-18 VITALS — BP 170/74 | HR 53 | Ht 62.0 in | Wt 144.0 lb

## 2019-09-18 DIAGNOSIS — R5383 Other fatigue: Secondary | ICD-10-CM

## 2019-09-18 DIAGNOSIS — R079 Chest pain, unspecified: Secondary | ICD-10-CM

## 2019-09-18 DIAGNOSIS — Z87898 Personal history of other specified conditions: Secondary | ICD-10-CM | POA: Diagnosis not present

## 2019-09-18 DIAGNOSIS — I10 Essential (primary) hypertension: Secondary | ICD-10-CM | POA: Diagnosis not present

## 2019-09-18 DIAGNOSIS — R001 Bradycardia, unspecified: Secondary | ICD-10-CM

## 2019-09-18 DIAGNOSIS — R0989 Other specified symptoms and signs involving the circulatory and respiratory systems: Secondary | ICD-10-CM | POA: Diagnosis not present

## 2019-09-18 MED ORDER — CLONIDINE HCL 0.1 MG PO TABS: 0.1000 mg | ORAL_TABLET | Freq: Two times a day (BID) | ORAL | 6 refills | Status: DC

## 2019-09-18 NOTE — Addendum Note (Signed)
Addended by: Lesle Chris on: 09/18/2019 11:02 AM   Modules accepted: Orders

## 2019-09-18 NOTE — Patient Instructions (Addendum)
Medication Instructions:   Begin Clonidine 0.1mg  twice a day.   Continue all other medications.    Labwork:  Vit D, Vit B12, TSH, T3, T4 - orders given today.   Patient will do at her pcp office.   Office will contact with results via phone or letter.    Testing/Procedures:  Your physician has requested that you have a carotid duplex. This test is an ultrasound of the carotid arteries in your neck. It looks at blood flow through these arteries that supply the brain with blood. Allow one hour for this exam. There are no restrictions or special instructions.  Office will contact with results via phone or letter.    Follow-Up: 1 month   Any Other Special Instructions Will Be Listed Below (If Applicable).   2 week - nurse visit for BP check   Your physician has requested that you regularly monitor and record your blood pressure readings at home. Please use the same machine at the same time of day to check your readings and record them to bring to your nurse visit.    If you need a refill on your cardiac medications before your next appointment, please call your pharmacy.

## 2019-09-22 DIAGNOSIS — R5383 Other fatigue: Secondary | ICD-10-CM | POA: Diagnosis not present

## 2019-09-23 DIAGNOSIS — E875 Hyperkalemia: Secondary | ICD-10-CM | POA: Diagnosis not present

## 2019-09-23 DIAGNOSIS — N183 Chronic kidney disease, stage 3 unspecified: Secondary | ICD-10-CM | POA: Diagnosis not present

## 2019-09-23 DIAGNOSIS — I129 Hypertensive chronic kidney disease with stage 1 through stage 4 chronic kidney disease, or unspecified chronic kidney disease: Secondary | ICD-10-CM | POA: Diagnosis not present

## 2019-09-23 DIAGNOSIS — K219 Gastro-esophageal reflux disease without esophagitis: Secondary | ICD-10-CM | POA: Diagnosis not present

## 2019-09-25 ENCOUNTER — Other Ambulatory Visit: Payer: Self-pay | Admitting: Family Medicine

## 2019-09-25 ENCOUNTER — Ambulatory Visit (INDEPENDENT_AMBULATORY_CARE_PROVIDER_SITE_OTHER): Payer: Medicare Other

## 2019-09-25 ENCOUNTER — Other Ambulatory Visit: Payer: Self-pay

## 2019-09-25 DIAGNOSIS — I6523 Occlusion and stenosis of bilateral carotid arteries: Secondary | ICD-10-CM

## 2019-09-25 DIAGNOSIS — R0989 Other specified symptoms and signs involving the circulatory and respiratory systems: Secondary | ICD-10-CM

## 2019-10-01 ENCOUNTER — Telehealth: Payer: Self-pay | Admitting: *Deleted

## 2019-10-01 NOTE — Telephone Encounter (Signed)
Lesle Chris, LPN  04/29/4257 5:63 PM EDT Back to Top    Notified, copy to pcp.    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 She had some mild narrowing in her left carotid artery. Tell her we will probably get a yearly carotid study to make sure the narrowing is not getting any worse. Thank you   Netta Neat., NP  09/25/2019 2:38 PM EDT     Please call the patient and let her know that her right carotid artery shows some medium plaque and left carotid artery shows some mild plaque. We will need to do a follow-up study in 12 months to keep an eye on this. Thanks

## 2019-10-02 ENCOUNTER — Other Ambulatory Visit: Payer: Self-pay

## 2019-10-02 ENCOUNTER — Ambulatory Visit: Payer: Medicare Other | Admitting: *Deleted

## 2019-10-02 DIAGNOSIS — I10 Essential (primary) hypertension: Secondary | ICD-10-CM

## 2019-10-02 NOTE — Progress Notes (Signed)
Pt here for BP check - started clonidine 0.1 mg bid - denies any complaints with medications at this time - says pcp decreased one BP medication but doesn't know which was it was ([place]) will let us know when she gets home - BP today by manuel check 120/58 HR 44 -c/o being tired - also requested labs results (scanned into chart) - discussed with Nena Polio, NP who wants pt to stop clonidine and continue to monitor BP/HR and call us if HR doesn't improve and Vitamin D was low B12 and Thyroid were ok. Pt voiced understanding

## 2019-10-15 NOTE — Progress Notes (Signed)
Cardiology Office Note  Date: 10/16/2019   ID: Katie Chan, DOB [birth year], MRN [id number]  PCP:  Lawerance Sabal, PA  Cardiologist:  No primary care provider on file. Electrophysiologist:  None   Chief Complaint: Follow-up chest pain  History of Present Illness: Katie Chan is a 69 y.o. female with a history of chest pain, bradycardia, HTN.  Previously seen by Katie Chan 08/07/2017 for frequent episodes of chest discomfort.  Reported having retrosternal chest discomfort with associated fatigue over the past several months typically lasting for less than a minute then spontaneously resolving.  EKG showed no acute ischemic changes.  Nuclear stress test on 08/09/2017 was normal.  Calculated EF on stress test was less than 30%.  Echocardiogram was performed showed EF of 65 to 70% with no R WMA's, trivial AI and trivial TR.  Event monitor: Normal sinus rhythm with some episodes of sinus tachycardia but no significant arrhythmias.  Seen by Katie An, PA 08/30/2017 and reported overall doing well.  Denied any repeated episodes of chest discomfort.  Previously had issues with bradycardia.  Metoprolol was stopped by PCP.  She was switched to hydralazine 25 mg 3 times daily.  Heart rate was in the 60s during that visit.  Recently seen by Katie Dates, NP who discovered a right carotid bruit.  Presented last visit with complaints of increasing dyspnea on exertion, increasing chest pressure, fatigue, lower extremity swelling.  She stated when she walks her dogs she has increasing chest pressure.  She rates the chest pressure at 6 out of 10 in severity lasting a few minutes until she stops her activity then resolves.  States she has issues with increasing fatigue.  Recently saw PCP who heard a carotid bruit on the right side.  She is on 4 separate blood pressure medications including; hydralazine 25 mg p.o. 3 times daily, amlodipine 10 mg daily, HCTZ 25 mg daily, lisinopril 40 mg daily.   Patient states she believes this is too much and recently was told she was dehydrated and needed to drink more water.  States she had recent lab work from PCP.  We do not have those results.  States she had to take a week off because she just feels significantly fatigued with accompanying shortness of breath and chest tightness.  At last visit results of stress test, cardiac monitor and echo results reviewed with patient.  She verbalizes understanding.  Her PCP just recently decreased her lisinopril to half a pill to 20 mg daily.  Today blood pressure is elevated at 170/74.  Her only complaint today is feeling tired all the time she states she did not start feeling tired until after a few months back she had a spell where she felt really bad for approximately 3 days with some shortness of breath.  She states since that time she has felt tired all of the time.  She denies any anginal symptoms, orthostatic symptoms, CVA or TIA-like symptoms, PND, orthopnea, lower extremity edema.  Had a recent nursing visit for blood pressure check on 10/02/2019.  She had started clonidine 0.1 mg p.o. twice daily.  She denied any complaints with medications at that time.  She stated her PCP decreased one blood pressure medication but did not know what it was.  Her blood pressure at that check was 120/58 and heart rate was 44.  She complained of being tired.  Clonidine was stopped and she was to continue monitoring her blood pressure and heart rate.  Patient states she  is feeling better today and almost back to normal.  Her current antihypertensive regimen includes: Amlodipine 10 mg daily, hydralazine 25 mg p.o. 3 times daily, HCTZ 25 mg daily, lisinopril 20 mg.  She stated her PCP had decreased approximately 2 to 3 weeks ago.  He denies any anginal or exertional symptoms, palpitations or arrhythmias, orthostatic symptoms, CVA or TIA-like symptoms, PND, orthopnea, claudication-like symptoms, DVT or PE-like symptoms, or lower  extremity edema.  We discussed her recent carotid artery duplex study showing 40 to 59% stenosis and right ICA and 1 to 39% stenosis in left ICA.  Patient verbalizes understanding.   Past Medical History:  Diagnosis Date  . Fracture of radius, distal, right, closed 05/04/2015  . GERD (gastroesophageal reflux disease)   . Hypertension     Past Surgical History:  Procedure Laterality Date  . OPEN REDUCTION INTERNAL FIXATION (ORIF) DISTAL RADIAL FRACTURE Right 05/04/2015   Procedure: OPEN REDUCTION INTERNAL FIXATION (ORIF) RIGHT DISTAL RADIUS FRACTURE;  Surgeon: Teryl Lucy, MD;  Location: [facility];  Service: Orthopedics;  Laterality: Right;  . TONSILLECTOMY AND ADENOIDECTOMY      Current Outpatient Medications  Medication Sig Dispense Refill  . amLODipine (NORVASC) 10 MG tablet Take 10 mg by mouth every morning.    . gabapentin (NEURONTIN) 300 MG capsule Take 300 mg by mouth at bedtime.    . hydrALAZINE (APRESOLINE) 25 MG tablet Take 25 mg by mouth 3 (three) times daily.  0  . hydrochlorothiazide (HYDRODIURIL) 25 MG tablet Take 25 mg by mouth every morning.    Marland Kitchen lisinopril (ZESTRIL) 40 MG tablet Take 0.5 tablets by mouth daily. Decreased by pcp 2-3 wks ago.    Marland Kitchen omeprazole (PRILOSEC) 40 MG capsule Take 1 capsule by mouth every morning.     No current facility-administered medications for this visit.   Allergies:  Patient has no known allergies.   Social History: The patient  reports that she has never smoked. She has never used smokeless tobacco.   Family History: The patient's family history includes COPD in her father; Cancer in her maternal grandfather; Diabetes in her mother and sister; Heart attack in her father and mother; Heart attack (age of onset: 55) in her paternal grandfather; Heart disease in her father and mother.   ROS:  Please see the history of present illness. Otherwise, complete review of systems is positive for none.  All other systems are reviewed  and negative.   Physical Exam: VS:  BP (!) 146/58   Pulse (!) 58   Ht 5\' 2"  (1.575 m)   Wt 144 lb (65.3 kg)   SpO2 96%   BMI 26.34 kg/m , BMI Body mass index is 26.34 kg/m.  Wt Readings from Last 3 Encounters:  10/16/19 144 lb (65.3 kg)  09/18/19 144 lb (65.3 kg)  08/07/19 144 lb 12.8 oz (65.7 kg)    General: Patient appears comfortable at rest. Neck: Supple, no elevated JVP , right carotid bruit,  no thyromegaly. Lungs: Clear to auscultation, nonlabored breathing at rest. Cardiac: Regular rate and rhythm, no S3 or significant systolic murmur, no pericardial rub. Extremities: No pitting edema, distal pulses 2+. Skin: Warm and dry. Musculoskeletal: No kyphosis. Neuropsychiatric: Alert and oriented x3, affect grossly appropriate.  ECG:  Chan ECG dated 08/07/2019 was personally reviewed today and demonstrated:  Sinus bradycardia with a rate of 55.  Recent Labwork: No results found for requested labs within last 8760 hours.  No results found for: CHOL, TRIG, HDL, CHOLHDL, VLDL,  LDLCALC, LDLDIRECT  Other Studies Reviewed Today:  Carotid artery duplex study 09/25/2019  Right Carotid: Velocities in the right ICA are consistent with a 40-59% stenosis. Left Carotid: Velocities in the left ICA are consistent with a 1-39% stenosis. Final Katie Chan [id number] 09/25/2019 *See table(s) above for measurements and observations. Suggest follow up study in 12 months. Electronically signed by Julien Nordmann MD on 09/25/2019 at 6:09:36 PM. Vertebrals: Bilateral vertebral arteries demonstrate antegrade flow. Subclavians: Normal flow hemodynamics were seen in bilateral subclavian arteries.    NST, 08/14/2019 Study Result  Narrative & Impression   There was no ST segment deviation noted during stress.  This is a low risk study.  The left ventricular ejection fraction is hyperdynamic (>65%).  Small mild intensity apical defect with mild reversibility likely secondary to apical  thinning, cannot completely exclude mild apical ischemia. Either finding would support low risk.      Long term monitor 08/22/2019 Patient had a min HR of 24 bpm, max HR of 182 bpm, and avg HR of 61 bpm. Predominant underlying rhythm was Sinus Rhythm. 6 Supraventricular Tachycardia runs occurred, the run with the fastest interval lasting 7 beats with a max rate of 182 bpm, the longest lasting 4 beats with Chan avg rate of 101 bpm. 1 episode(s) of AV Block (2nd Mobitz II) occurred, lasting a total of 6 secs. Isolated SVEs were rare (<1.0%), SVE Couplets were rare (<1.0%), and SVE Triplets were rare (<1.0%). Isolated VEs were rare (<1.0%), VE Couplets were rare (<1.0%), and no VE Triplets were present.  Echocardiogram 08/14/2019  1. Left ventricular ejection fraction, by estimation, is 65 to 70%. The left ventricle has normal function. The left ventricle has no regional wall motion abnormalities. There is mild left ventricular hypertrophy. Left ventricular diastolic parameters were normal. 2. Right ventricular systolic function is normal. The right ventricular size is normal. There is normal pulmonary artery systolic pressure. 3. The mitral valve is normal in structure. Trivial mitral valve regurgitation. No evidence of mitral stenosis. 4. The aortic valve is tricuspid. Aortic valve regurgitation is not visualized. No aortic stenosis is present. 5. The inferior vena cava is normal in size with greater than 50% respiratory variability, suggesting right atrial pressure of 3 mmHg.   Echocardiogram 08/14/2017 Study Conclusions   - Left ventricle: The cavity size was normal. Wall thickness was  normal. Systolic function was vigorous. The estimated ejection  fraction was in the range of 65% to 70%. Wall motion was normal;  there were no regional wall motion abnormalities. Left  ventricular diastolic function parameters were normal.  - Aortic valve: Mildly calcified annulus.  Trileaflet. There was  trivial regurgitation.  - Mitral valve: There was trivial regurgitation.  - Right atrium: Central venous pressure (est): 3 mm Hg.  - Atrial septum: No defect or patent foramen ovale was identified.  - Tricuspid valve: There was trivial regurgitation.  - Pulmonary arteries: PA peak pressure: 23 mm Hg (S).  - Pericardium, extracardiac: There was no pericardial effusion.   Cardiac telemetry monitoring 08/13/2017. Study Highlights   Sinus rhythm and sinus tachycardia. No significant arrhythmias.    NST: 08/09/2017  Blood pressure demonstrated a normal response to exercise.  There was no ST segment deviation noted during stress.  The study is normal. There are no perfusion defects consistent with prior infarct or current ischemia.  This is a high risk study. Risk based on decreased LVEF. No evidence of active ischemia.  The left ventricular ejection fraction is severely  decreased (<30%).   Assessment and Plan:   1. Chest pain, unspecified type Described chest pressure/chest tightness when performing exertional activities such as walking her dog or when performing activities at work at last visit.  No complaints of chest pain/pressure today. Lexiscan Myoview showed small mild intensity apical defect with mild reversibility likely secondary to apical thinning.  Cannot completely exclude mild ischemia.  Either finding with support low risk..  2. Bradycardia History of bradycardia in the past.  Beta-blocker metoprolol was stopped by PCP.  EKG today shows sinus bradycardia with a rate of 55.  Patient is on no AV nodal blocking agents other than amlodipine which would not likely cause significant bradycardia.  3. Essential hypertension Blood pressure is better than previous visit at 146/58.  She states provider at [facility] reduced her lisinopril to 20 mg daily.  She is unsure as to why the dosage was decreased.  She is on 4 separate antihypertensive medications  including amlodipine 10 mg daily, hydralazine 25 mg 3 times daily, HCTZ 25 mg daily and lisinopril 20 mg.  We had started clonidine 0.1 mg p.o. twice daily for continued elevated blood pressure.  At recent nursing visit blood pressure was much improved at 120/58.  Clonidine was stopped due to her feeling fatigued.  4. Hx of dizziness Patient stated she had episodes of dizziness but no frank presyncopal or syncopal episodes.  Patient states she is feeling a little bit better since her lisinopril was decreased and clonidine was stopped.  No current episodes of dizziness.  5.  Right carotid artery bruit PCP heard a recent right carotid artery bruit on exam.  Carotid artery study showed right ICA stenosis of 40 to 59%, left ICA stenosis 1 to 39%.  We will follow with yearly carotid artery duplex studies  6.  Increasing dyspnea on exertion/shortness of breath Patient complains of increased dyspnea on exertion with exertional fatigue. Echocardiogram 08/14/2019 showed EF 65 to 70%.,  Mild LVH, trivial MR  7.  Fatigue. At last visit patient stated she was having significant fatigue and unable to perform activities at work.  She stated she had taken a week off due to significant increase in fatigue. Recent labs from PCP office showed glucose of 66, BUN 24, creatinine 1.28, GFR 43, potassium 5.3, hemoglobin 11.1, hematocrit 34.8.  Get a TSH, T3, T4, vitamin B12, and vitamin D.  Fatigue has improved since lisinopril dosage was decreased and clonidine was stopped.   Medication Adjustments/Labs and Tests Ordered: Current medicines are reviewed at length with the patient today.  Concerns regarding medicines are outlined above.   Disposition: Follow-up with Dr. Diona BrownerMcDowell or APP 6 months  Signed, Rennis HardingAndrew Quinn, NP 10/16/2019 10:32 AM    [facility] [address], [place], [place] [address] Phone: [phone]; Fax: [phone]

## 2019-10-16 ENCOUNTER — Encounter: Payer: Self-pay | Admitting: Family Medicine

## 2019-10-16 ENCOUNTER — Ambulatory Visit: Payer: Medicare Other | Admitting: Family Medicine

## 2019-10-16 VITALS — BP 146/58 | HR 58 | Ht 62.0 in | Wt 144.0 lb

## 2019-10-16 DIAGNOSIS — I10 Essential (primary) hypertension: Secondary | ICD-10-CM

## 2019-10-16 DIAGNOSIS — R079 Chest pain, unspecified: Secondary | ICD-10-CM | POA: Diagnosis not present

## 2019-10-16 DIAGNOSIS — R001 Bradycardia, unspecified: Secondary | ICD-10-CM

## 2019-10-16 NOTE — Patient Instructions (Signed)
Medication Instructions:  Continue all current medications.   Labwork: none  Testing/Procedures: none  Follow-Up: 6 months   Any Other Special Instructions Will Be Listed Below (If Applicable).   If you need a refill on your cardiac medications before your next appointment, please call your pharmacy.  

## 2019-10-23 DIAGNOSIS — E875 Hyperkalemia: Secondary | ICD-10-CM | POA: Diagnosis not present

## 2019-10-23 DIAGNOSIS — I129 Hypertensive chronic kidney disease with stage 1 through stage 4 chronic kidney disease, or unspecified chronic kidney disease: Secondary | ICD-10-CM | POA: Diagnosis not present

## 2019-10-23 DIAGNOSIS — N183 Chronic kidney disease, stage 3 unspecified: Secondary | ICD-10-CM | POA: Diagnosis not present

## 2019-10-23 DIAGNOSIS — K219 Gastro-esophageal reflux disease without esophagitis: Secondary | ICD-10-CM | POA: Diagnosis not present

## 2019-10-23 IMAGING — NM NM MYOCAR MULTI W/SPECT W/WALL MOTION & EF
2 series · 12 of 12 positions shown · non-contrast
Comparison: none

[Series 1: rest · 6.51mm/px · 6 of 64 frames shown]
[frame 6/64]
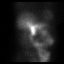
[frame 16/64]
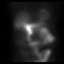
[frame 27/64]
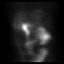
[frame 38/64]
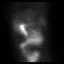
[frame 48/64]
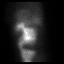
[frame 59/64]
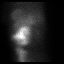

[Series 3: stress gated - perfusion · 6.51mm/px · 6 of 64 frames shown]
[frame 6/64]
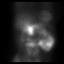
[frame 16/64]
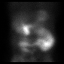
[frame 27/64]
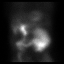
[frame 38/64]
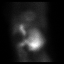
[frame 48/64]
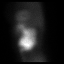
[frame 59/64]
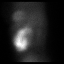

[12 of 12 positions shown; findings below may reference images not displayed]

Canned report from images found in remote index.

Refer to host system for actual result text.

## 2019-10-27 ENCOUNTER — Telehealth: Payer: Self-pay | Admitting: Family Medicine

## 2019-10-27 NOTE — Telephone Encounter (Signed)
Katie Chan [employer]) called from [facility].  Patient is confused about her medications. They need to clarify with our office.  Please call [phone].

## 2019-10-28 NOTE — Telephone Encounter (Signed)
Per Nena Polio, NP (see message below) kelly made aware and med list updated    As long as these blood pressure medication are managing her blood pressure well without any significant symptomatic issues this is okay. If she is doing okay without the clonidine that is okay as well. Thanks

## 2019-10-28 NOTE — Telephone Encounter (Signed)
The updated list I gave you below is what she is currently taking

## 2019-10-28 NOTE — Telephone Encounter (Signed)
amLODipine (NORVASC) 10 MG tablet Take 10 mg by mouth every morning.       gabapentin (NEURONTIN) 300 MG capsule Take 300 mg by mouth at bedtime.       hydrALAZINE (APRESOLINE) 25 MG tablet Take 25 mg by mouth 3 (three) times daily.   0   hydrochlorothiazide (HYDRODIURIL) 25 MG tablet Take 25 mg by mouth every morning.       lisinopril (ZESTRIL) 40 MG tablet Take 0.5 tablets by mouth daily. Decreased by pcp 2-3 wks ago.       omeprazole (PRILOSEC) 40 MG capsule Take 1 capsule by mouth every morning.      These are the listings of the medications I had and the dosage.  I have listed that she is taking only half a pill of lisinopril which means she is only taking 20.  Obviously should not be taking the clonidine.  Otherwise everything else should be accurate.

## 2019-10-28 NOTE — Telephone Encounter (Signed)
Kelly from [facility] wanted to clarify pt medications as she helps pt with her medications and getting them pill packed to the pharmacy - pt had still been taking clonidine 0.1 mg bid (this was supposed to be stopped 9/9 per nurse appt - has also been taking lisinopril 40 mg daily amlodipine 5 mg daily hydralazine 25 mg bid hctz 25 mg daily - aware that pt should stop clonidine but wanted to clarify with provider if pt was on the correct dose of the other BP medications

## 2019-11-22 DIAGNOSIS — K219 Gastro-esophageal reflux disease without esophagitis: Secondary | ICD-10-CM | POA: Diagnosis not present

## 2019-11-22 DIAGNOSIS — E875 Hyperkalemia: Secondary | ICD-10-CM | POA: Diagnosis not present

## 2019-11-22 DIAGNOSIS — I129 Hypertensive chronic kidney disease with stage 1 through stage 4 chronic kidney disease, or unspecified chronic kidney disease: Secondary | ICD-10-CM | POA: Diagnosis not present

## 2019-11-22 DIAGNOSIS — N183 Chronic kidney disease, stage 3 unspecified: Secondary | ICD-10-CM | POA: Diagnosis not present

## 2019-12-04 DIAGNOSIS — M79672 Pain in left foot: Secondary | ICD-10-CM | POA: Diagnosis not present

## 2019-12-04 DIAGNOSIS — M79671 Pain in right foot: Secondary | ICD-10-CM | POA: Diagnosis not present

## 2019-12-04 DIAGNOSIS — M25579 Pain in unspecified ankle and joints of unspecified foot: Secondary | ICD-10-CM | POA: Diagnosis not present

## 2019-12-23 DIAGNOSIS — N183 Chronic kidney disease, stage 3 unspecified: Secondary | ICD-10-CM | POA: Diagnosis not present

## 2019-12-23 DIAGNOSIS — M25579 Pain in unspecified ankle and joints of unspecified foot: Secondary | ICD-10-CM | POA: Diagnosis not present

## 2019-12-23 DIAGNOSIS — M79671 Pain in right foot: Secondary | ICD-10-CM | POA: Diagnosis not present

## 2019-12-23 DIAGNOSIS — E875 Hyperkalemia: Secondary | ICD-10-CM | POA: Diagnosis not present

## 2019-12-23 DIAGNOSIS — M79672 Pain in left foot: Secondary | ICD-10-CM | POA: Diagnosis not present

## 2019-12-23 DIAGNOSIS — I129 Hypertensive chronic kidney disease with stage 1 through stage 4 chronic kidney disease, or unspecified chronic kidney disease: Secondary | ICD-10-CM | POA: Diagnosis not present

## 2020-01-23 DIAGNOSIS — N183 Chronic kidney disease, stage 3 unspecified: Secondary | ICD-10-CM | POA: Diagnosis not present

## 2020-01-23 DIAGNOSIS — I129 Hypertensive chronic kidney disease with stage 1 through stage 4 chronic kidney disease, or unspecified chronic kidney disease: Secondary | ICD-10-CM | POA: Diagnosis not present

## 2020-01-23 DIAGNOSIS — E875 Hyperkalemia: Secondary | ICD-10-CM | POA: Diagnosis not present

## 2020-01-23 DIAGNOSIS — K219 Gastro-esophageal reflux disease without esophagitis: Secondary | ICD-10-CM | POA: Diagnosis not present

## 2020-02-10 DIAGNOSIS — M79671 Pain in right foot: Secondary | ICD-10-CM | POA: Diagnosis not present

## 2020-02-10 DIAGNOSIS — M79672 Pain in left foot: Secondary | ICD-10-CM | POA: Diagnosis not present

## 2020-02-21 DIAGNOSIS — K219 Gastro-esophageal reflux disease without esophagitis: Secondary | ICD-10-CM | POA: Diagnosis not present

## 2020-02-21 DIAGNOSIS — E875 Hyperkalemia: Secondary | ICD-10-CM | POA: Diagnosis not present

## 2020-02-21 DIAGNOSIS — N183 Chronic kidney disease, stage 3 unspecified: Secondary | ICD-10-CM | POA: Diagnosis not present

## 2020-02-21 DIAGNOSIS — I129 Hypertensive chronic kidney disease with stage 1 through stage 4 chronic kidney disease, or unspecified chronic kidney disease: Secondary | ICD-10-CM | POA: Diagnosis not present

## 2020-04-02 ENCOUNTER — Other Ambulatory Visit: Payer: Self-pay | Admitting: *Deleted

## 2020-04-02 DIAGNOSIS — I6523 Occlusion and stenosis of bilateral carotid arteries: Secondary | ICD-10-CM

## 2020-04-15 ENCOUNTER — Encounter: Payer: Self-pay | Admitting: Cardiology

## 2020-04-15 ENCOUNTER — Ambulatory Visit: Payer: Medicare Other | Admitting: Cardiology

## 2020-04-15 NOTE — Progress Notes (Deleted)
Cardiology Office Note  Date: 04/15/2020   ID: Katie Chan, DOB July 29, 1950, MRN [id number]  PCP:  Katie Sabal, PA  Cardiologist:  Katie Dell, MD Electrophysiologist:  None   No chief complaint on file.   History of Present Illness: Katie Chan is a 70 y.o. female former patient of Katie Chan now presenting to establish follow-up with me.  I reviewed her records and updated the chart.  She was last seen in September 2021 by Mr. Vincenza Hews NP.  Carotid Dopplers from September 2021 revealed 40 to 59% RICA stenosis and 1 to 39% LICA stenosis.  I do not have recent lipids for review, LDL was 122 in 2019.   Past Medical History:  Diagnosis Date  . Carotid artery disease (HCC)   . Essential hypertension   . Fracture of radius, distal, right, closed 05/04/2015  . GERD (gastroesophageal reflux disease)     Past Surgical History:  Procedure Laterality Date  . OPEN REDUCTION INTERNAL FIXATION (ORIF) DISTAL RADIAL FRACTURE Right 05/04/2015   Procedure: OPEN REDUCTION INTERNAL FIXATION (ORIF) RIGHT DISTAL RADIUS FRACTURE;  Surgeon: Katie Lucy, MD;  Location: [facility];  Service: Orthopedics;  Laterality: Right;  . TONSILLECTOMY AND ADENOIDECTOMY      Current Outpatient Medications  Medication Sig Dispense Refill  . amLODipine (NORVASC) 5 MG tablet Take 5 mg by mouth daily.    Marland Kitchen gabapentin (NEURONTIN) 300 MG capsule Take 300 mg by mouth at bedtime.    . hydrALAZINE (APRESOLINE) 25 MG tablet Take 25 mg by mouth in the morning and at bedtime.    . hydrochlorothiazide (HYDRODIURIL) 25 MG tablet Take 25 mg by mouth every morning.    Marland Kitchen lisinopril (ZESTRIL) 40 MG tablet Take 40 mg by mouth daily.    Marland Kitchen omeprazole (PRILOSEC) 40 MG capsule Take 1 capsule by mouth every morning.     No current facility-administered medications for this visit.   Allergies:  Patient has no known allergies.   Social History: The patient  reports that she has never smoked.  She has never used smokeless tobacco. She reports that she does not drink alcohol and does not use drugs.   Family History: The patient's family history includes COPD in her father; Cancer in her maternal grandfather; Diabetes in her mother and sister; Heart attack in her father and mother; Heart attack (age of onset: 60) in her paternal grandfather; Heart disease in her father and mother.   ROS:  Please see the history of present illness. Otherwise, complete review of systems is positive for {NONE DEFAULTED:18576::"none"}.  All other systems are reviewed and negative.   Physical Exam: VS:  There were no vitals taken for this visit., BMI There is no height or weight on file to calculate BMI.  Wt Readings from Last 3 Encounters:  10/16/19 144 lb (65.3 kg)  09/18/19 144 lb (65.3 kg)  08/07/19 144 lb 12.8 oz (65.7 kg)    General: Patient appears comfortable at rest. HEENT: Conjunctiva and lids normal, oropharynx clear with moist mucosa. Neck: Supple, no elevated JVP or carotid bruits, no thyromegaly. Lungs: Clear to auscultation, nonlabored breathing at rest. Cardiac: Regular rate and rhythm, no S3 or significant systolic murmur, no pericardial rub. Abdomen: Soft, nontender, no hepatomegaly, bowel sounds present, no guarding or rebound. Extremities: No pitting edema, distal pulses 2+. Skin: Warm and dry. Musculoskeletal: No kyphosis. Neuropsychiatric: Alert and oriented x3, affect grossly appropriate.  ECG:  An ECG dated 08/07/2019 was personally reviewed today and  demonstrated:  Sinus bradycardia with left anterior fascicular block, increased voltage, significant lead motion artifact.  Recent Labwork:  September 2021: BUN 24, creatinine 1.28, potassium 5.3, AST 14, ALT 10, hemoglobin 11.1, platelets 256 August 2021: TSH 3.1  Other Studies Reviewed Today:  Lexiscan Myoview 08/14/2019:  There was no ST segment deviation noted during stress.  This is a low risk study.  The left  ventricular ejection fraction is hyperdynamic (>65%).  Small mild intensity apical defect with mild reversibility likely secondary to apical thinning, cannot completely exclude mild apical ischemia. Either finding would support low risk.  Echocardiogram 08/14/2019: 1. Left ventricular ejection fraction, by estimation, is 65 to 70%. The  left ventricle has normal function. The left ventricle has no regional  wall motion abnormalities. There is mild left ventricular hypertrophy.  Left ventricular diastolic parameters  were normal.  2. Right ventricular systolic function is normal. The right ventricular  size is normal. There is normal pulmonary artery systolic pressure.  3. The mitral valve is normal in structure. Trivial mitral valve  regurgitation. No evidence of mitral stenosis.  4. The aortic valve is tricuspid. Aortic valve regurgitation is not  visualized. No aortic stenosis is present.  5. The inferior vena cava is normal in size with greater than 50%  respiratory variability, suggesting right atrial pressure of 3 mmHg.   Carotid Dopplers 09/25/2019: Summary:  Right Carotid: Velocities in the right ICA are consistent with a 40-59%         stenosis.   Left Carotid: Velocities in the left ICA are consistent with a 1-39%  stenosis.   Vertebrals: Bilateral vertebral arteries demonstrate antegrade flow.  Subclavians: Normal flow hemodynamics were seen in bilateral subclavian        arteries.   Assessment and Plan:    Medication Adjustments/Labs and Tests Ordered: Current medicines are reviewed at length with the patient today.  Concerns regarding medicines are outlined above.   Tests Ordered: No orders of the defined types were placed in this encounter.   Medication Changes: No orders of the defined types were placed in this encounter.   Disposition:  Follow up {follow up:15908}  Signed, Katie Sidle, MD, [facility] 04/15/2020 12:44 PM    [facility] [address] [place], [place], [place] [address] Phone: [phone]; Fax: [phone]

## 2020-08-17 ENCOUNTER — Ambulatory Visit: Payer: Medicare Other | Admitting: Cardiology

## 2020-08-17 NOTE — Progress Notes (Deleted)
Cardiology Office Note  Date: 08/17/2020   ID: [name], DOB 04-15-1950, MRN [id number]  PCP:  Lawerance Sabal, PA  Cardiologist:  None Electrophysiologist:  None   No chief complaint on file.   History of Present Illness: Katie Chan is a 70 y.o. female former patient of Dr. Purvis Sheffield now presenting to establish follow-up with me.  I reviewed her records and updated the chart.  She was last seen in September 2021 by Mr. Vincenza Hews NP.  Past Medical History:  Diagnosis Date   Carotid artery disease (HCC)    Essential hypertension    Fracture of radius, distal, right, closed 05/04/2015   GERD (gastroesophageal reflux disease)     Past Surgical History:  Procedure Laterality Date   OPEN REDUCTION INTERNAL FIXATION (ORIF) DISTAL RADIAL FRACTURE Right 05/04/2015   Procedure: OPEN REDUCTION INTERNAL FIXATION (ORIF) RIGHT DISTAL RADIUS FRACTURE;  Surgeon: Teryl Lucy, MD;  Location: [facility];  Service: Orthopedics;  Laterality: Right;   TONSILLECTOMY AND ADENOIDECTOMY      Current Outpatient Medications  Medication Sig Dispense Refill   amLODipine (NORVASC) 5 MG tablet Take 5 mg by mouth daily.     gabapentin (NEURONTIN) 300 MG capsule Take 300 mg by mouth at bedtime.     hydrALAZINE (APRESOLINE) 25 MG tablet Take 25 mg by mouth in the morning and at bedtime.     hydrochlorothiazide (HYDRODIURIL) 25 MG tablet Take 25 mg by mouth every morning.     lisinopril (ZESTRIL) 40 MG tablet Take 40 mg by mouth daily.     omeprazole (PRILOSEC) 40 MG capsule Take 1 capsule by mouth every morning.     No current facility-administered medications for this visit.   Allergies:  Patient has no known allergies.   Social History: The patient  reports that she has never smoked. She has never used smokeless tobacco. She reports that she does not drink alcohol and does not use drugs.   Family History: The patient's family history includes COPD in her father; Cancer in  her maternal grandfather; Diabetes in her mother and sister; Heart attack in her father and mother; Heart attack (age of onset: 19) in her paternal grandfather; Heart disease in her father and mother.   ROS:  Please see the history of present illness. Otherwise, complete review of systems is positive for {NONE DEFAULTED:18576}.  All other systems are reviewed and negative.   Physical Exam: VS:  There were no vitals taken for this visit., BMI There is no height or weight on file to calculate BMI.  Wt Readings from Last 3 Encounters:  10/16/19 144 lb (65.3 kg)  09/18/19 144 lb (65.3 kg)  08/07/19 144 lb 12.8 oz (65.7 kg)    General: Patient appears comfortable at rest. HEENT: Conjunctiva and lids normal, oropharynx clear with moist mucosa. Neck: Supple, no elevated JVP or carotid bruits, no thyromegaly. Lungs: Clear to auscultation, nonlabored breathing at rest. Cardiac: Regular rate and rhythm, no S3 or significant systolic murmur, no pericardial rub. Abdomen: Soft, nontender, no hepatomegaly, bowel sounds present, no guarding or rebound. Extremities: No pitting edema, distal pulses 2+. Skin: Warm and dry. Musculoskeletal: No kyphosis. Neuropsychiatric: Alert and oriented x3, affect grossly appropriate.  ECG:  An ECG dated 08/07/2019 was personally reviewed today and demonstrated:  Sinus rhythm with LVH and significant lead motion artifact.  Recent Labwork:  July 2021: BUN 24, creatinine 1.28, potassium 5.3, AST 14, ALT 10, hemoglobin 11.1, platelets 256 August 2021: TSH 3.1  Other Studies Reviewed Today:  Echocardiogram 08/14/2019:  1. Left ventricular ejection fraction, by estimation, is 65 to 70%. The  left ventricle has normal function. The left ventricle has no regional  wall motion abnormalities. There is mild left ventricular hypertrophy.  Left ventricular diastolic parameters  were normal.   2. Right ventricular systolic function is normal. The right ventricular  size is  normal. There is normal pulmonary artery systolic pressure.   3. The mitral valve is normal in structure. Trivial mitral valve  regurgitation. No evidence of mitral stenosis.   4. The aortic valve is tricuspid. Aortic valve regurgitation is not  visualized. No aortic stenosis is present.   5. The inferior vena cava is normal in size with greater than 50%  respiratory variability, suggesting right atrial pressure of 3 mmHg.   Lexiscan Myoview 08/14/2019: There was no ST segment deviation noted during stress. This is a low risk study. The left ventricular ejection fraction is hyperdynamic (>65%). Small mild intensity apical defect with mild reversibility likely secondary to apical thinning, cannot completely exclude mild apical ischemia. Either finding would support low risk.  Carotid Dopplers 09/25/2019: Summary:  Right Carotid: Velocities in the right ICA are consistent with a 40-59%                 stenosis.   Left Carotid: Velocities in the left ICA are consistent with a 1-39%  stenosis.   Vertebrals:  Bilateral vertebral arteries demonstrate antegrade flow.  Subclavians: Normal flow hemodynamics were seen in bilateral subclavian               arteries.   Assessment and Plan:    Medication Adjustments/Labs and Tests Ordered: Current medicines are reviewed at length with the patient today.  Concerns regarding medicines are outlined above.   Tests Ordered: No orders of the defined types were placed in this encounter.   Medication Changes: No orders of the defined types were placed in this encounter.   Disposition:  Follow up {follow up:15908}  Signed, Jonelle Sidle, MD, [facility] 08/17/2020 9:06 AM    [facility] [address] [place], [place], [place] [address] Phone: [phone]; Fax: [phone]

## 2020-10-19 ENCOUNTER — Encounter (INDEPENDENT_AMBULATORY_CARE_PROVIDER_SITE_OTHER): Payer: Self-pay | Admitting: *Deleted

## 2020-11-08 ENCOUNTER — Ambulatory Visit (INDEPENDENT_AMBULATORY_CARE_PROVIDER_SITE_OTHER): Payer: Medicare Other | Admitting: Gastroenterology

## 2020-11-08 ENCOUNTER — Other Ambulatory Visit (INDEPENDENT_AMBULATORY_CARE_PROVIDER_SITE_OTHER): Payer: Self-pay

## 2020-11-08 ENCOUNTER — Encounter (INDEPENDENT_AMBULATORY_CARE_PROVIDER_SITE_OTHER): Payer: Self-pay

## 2020-11-08 ENCOUNTER — Encounter (INDEPENDENT_AMBULATORY_CARE_PROVIDER_SITE_OTHER): Payer: Self-pay | Admitting: Gastroenterology

## 2020-11-08 ENCOUNTER — Other Ambulatory Visit: Payer: Self-pay

## 2020-11-08 DIAGNOSIS — R112 Nausea with vomiting, unspecified: Secondary | ICD-10-CM

## 2020-11-08 DIAGNOSIS — K529 Noninfective gastroenteritis and colitis, unspecified: Secondary | ICD-10-CM

## 2020-11-08 DIAGNOSIS — G8929 Other chronic pain: Secondary | ICD-10-CM

## 2020-11-08 DIAGNOSIS — R1013 Epigastric pain: Secondary | ICD-10-CM

## 2020-11-08 MED ORDER — ONDANSETRON HCL 4 MG PO TABS: 4.0000 mg | ORAL_TABLET | Freq: Three times a day (TID) | ORAL | 1 refills | Status: DC | PRN

## 2020-11-08 NOTE — Progress Notes (Signed)
Williard Keller Castaneda, M.D. Gastroenterology & Hepatology [facility] [address] [place], [place] [address] Primary Care Physician: Worley, [name], PA [address] [place] [address]  Referring MD: PCP  Chief Complaint: Chronic diarrhea, nausea, vomiting, abdominal pain.  History of Present Illness: Katie Chan is a 70 y.o. female with past medical history of carotid artery disease, hypertension, GERD, bradycardia, who presents for evaluation of chronic diarrhea, nausea, vomiting, abdominal pain.  Patient reprots that for the last 2 years she has presented persistent diarrhea episodes. She states she was seen by her PCP initially when her symptoms started and had stool testing. She does not bring any records with her but as far as I can retrieve from [facility] she had a positive stool testing for Campylobacter. Had negative testing for C. Difficile. She reports she received a course of antibiotics for 10 days.  The antibiotics slowed down the severity of the diarrhea to possibly 4-5 days a week but 2 months ago it worsened again to the point she is having daily diarrhea. She is currently having 4-5 bowel movements per day while taking taking Bentyl 10 mg twice a day, which is better to how it was before as she had it multiple times per day. Stool is usually soft but more recently watery. No melena or hematochezia. Has had multiple fecal soling per week, up to three times per week. She has had nighttime episodes. She takes 3-4 tablets of Imodium a day, but stopped after she started taking the Bentyl on Friday.  She states that it would help decreasing the frequency by 3 hours but then the diarrhea would come back. She has also presented intermittent cramping for the last two years, which improved after she vomited. Has been present every day for the last week. Also reports feeling frequent bloating.  For the last week she has presented  frequent vomiting on a daily, which was infrequent in the past.  The patient denies having any fever, chills, hematochezia, melena, hematemesis, jaundice, pruritus or weight loss.  The patient does not follow any type of diet.  Patient's states that sometimes she takes BC powders for joint pain.  2 months ago she started taking meloxicam but stopped taking it a month ago.  Last EGD:never Last Colonoscopy:never  FHx: neg for any gastrointestinal/liver disease, no malignancies Social: neg smoking, alcohol or illicit drug use Surgical: no abdominal surgeries  Past Medical History: Past Medical History:  Diagnosis Date   Carotid artery disease (HCC)    Essential hypertension    Fracture of radius, distal, right, closed 05/04/2015   GERD (gastroesophageal reflux disease)     Past Surgical History: Past Surgical History:  Procedure Laterality Date   OPEN REDUCTION INTERNAL FIXATION (ORIF) DISTAL RADIAL FRACTURE Right 05/04/2015   Procedure: OPEN REDUCTION INTERNAL FIXATION (ORIF) RIGHT DISTAL RADIUS FRACTURE;  Surgeon: Joshua Landau, MD;  Location: [facility];  Service: Orthopedics;  Laterality: Right;   TONSILLECTOMY AND ADENOIDECTOMY      Family History: Family History  Problem Relation Age of Onset   Heart disease Mother        stents placed in heart, PPM   Diabetes Mother    Heart attack Mother    Heart disease Father    Heart attack Father    COPD Father    Diabetes Sister    Cancer Maternal Grandfather    Heart attack Paternal Grandfather 42       massive heart   attack    Social History: Social History   Tobacco Use  Smoking Status Never  Smokeless Tobacco Never   Social History   Substance and Sexual Activity  Alcohol Use Never   Social History   Substance and Sexual Activity  Drug Use Never    Allergies: No Known Allergies  Medications: Current Outpatient Medications  Medication Sig Dispense Refill   amLODipine (NORVASC) 5 MG  tablet Take 5 mg by mouth daily.     dicyclomine (BENTYL) 10 MG capsule Take 10 mg by mouth in the morning and at bedtime.     gabapentin (NEURONTIN) 300 MG capsule Take 300 mg by mouth at bedtime.     hydrALAZINE (APRESOLINE) 25 MG tablet Take 25 mg by mouth 3 (three) times daily.     hydrochlorothiazide (HYDRODIURIL) 25 MG tablet Take 25 mg by mouth every morning.     lisinopril (ZESTRIL) 40 MG tablet Take 40 mg by mouth daily.     meloxicam (MOBIC) 7.5 MG tablet Take 7.5 mg by mouth as needed for pain.     omeprazole (PRILOSEC) 40 MG capsule Take 1 capsule by mouth every morning.     No current facility-administered medications for this visit.    Review of Systems: GENERAL: negative for malaise, night sweats HEENT: No changes in hearing or vision, no nose bleeds or other nasal problems. NECK: Negative for lumps, goiter, pain and significant neck swelling RESPIRATORY: Negative for cough, wheezing CARDIOVASCULAR: Negative for chest pain, leg swelling, palpitations, orthopnea GI: SEE HPI MUSCULOSKELETAL: Negative for joint pain or swelling, back pain, and muscle pain. SKIN: Negative for lesions, rash PSYCH: Negative for sleep disturbance, mood disorder and recent psychosocial stressors. HEMATOLOGY Negative for prolonged bleeding, bruising easily, and swollen nodes. ENDOCRINE: Negative for cold or heat intolerance, polyuria, polydipsia and goiter. NEURO: negative for tremor, gait imbalance, syncope and seizures. The remainder of the review of systems is noncontributory.   Physical Exam: BP 133/67 (BP Location: Left Arm, Patient Position: Sitting, Cuff Size: Large)   Pulse 67   Temp 98.1 F (36.7 C) (Oral)   Ht 5' 2" (1.575 m)   Wt 142 lb 14.4 oz (64.8 kg)   BMI 26.14 kg/m  GENERAL: The patient is AO x3, in no acute distress. HEENT: Head is normocephalic and atraumatic. EOMI are intact. Mouth is well hydrated and without lesions. NECK: Supple. No masses LUNGS: Clear to  auscultation. No presence of rhonchi/wheezing/rales. Adequate chest expansion HEART: RRR, normal s1 and s2. ABDOMEN: very mildly tender in the epigastric area, no guarding, no peritoneal signs, and nondistended. BS +. No masses. EXTREMITIES: Without any cyanosis, clubbing, rash, lesions or edema. NEUROLOGIC: AOx3, no focal motor deficit. SKIN: no jaundice, no rashes   Imaging/Labs: as above  I personally reviewed and interpreted the available labs, imaging and endoscopic files.  Impression and Plan: Katie Chan is a 70 y.o. female with past medical history of carotid artery disease, hypertension, GERD, bradycardia, who presents for evaluation of chronic diarrhea, nausea, vomiting, abdominal pain.  The patient has presented multiple gastrointestinal complaints, being chronic diarrhea the main problem that has worsened especially during the last 2 months.  She has presented frequent fecal soiling and nocturnal accidents.  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  We will need to check serology for evaluation of thyroid dysfunction or celiac disease, but   he will be important to know what stool testing has been performed recently as I do not have access to the most recent stool tests performed a week ago.  Depending on which test were sent, will need to order further stool testing to rule out chronic gastrointestinal infections and pancreatic insufficiency.  It is unlikely her disease related to medications that she is not taking supplements that could cause it and is very unlikely this is related to lisinopril.  I explained to the patient that given the chronicity of her symptoms it would be important to explore it further with an EGD and colonoscopy, she would like to hold off on a colonoscopy unless strictly necessary as she  believes she will be able to tolerate the prep at all and that is why she has never had one in the past.  We may need to discuss this again in the future.  For now, she can continue taking the Bentyl twice a day and Imodium as needed for the diarrhea episodes, but also she can take Zofran as needed for the episode of nausea.  Will benefit as well from avoiding taking any NSAIDs or high-dose aspirin components.  -Schedule EGD  -Check tSH and celiac serology -Avoid using NSAIDs such as Goody powders, BC powders, Aleve, ibuprofen, naproxen, Motrin, Voltaren or Advil (even the topical ones) -Can continue Imodium as needed for diarrhea episodes -Continue with dicyclomine 10 mg twice a day -Patient will request to fax results of recent stool testing -May need to proceed with colonoscopy is testing is negative for any specific disorder, may need to add some other stool tests as well -Can take Zofran as needed for nausea - RTC 3 months  All questions were answered.      Katrinka Blazing, MD Gastroenterology and Hepatology [facility]

## 2020-11-08 NOTE — H&P (View-Only) (Signed)
Katrinka Blazing, M.D. Gastroenterology & Hepatology [facility] [address] [place], [place] [address] Primary Care Physician: Lawerance Sabal, [place] [address] [place] [address]  Referring MD: PCP  Chief Complaint: Chronic diarrhea, nausea, vomiting, abdominal pain.  History of Present Illness: Katie Chan is a 70 y.o. female with past medical history of carotid artery disease, hypertension, GERD, bradycardia, who presents for evaluation of chronic diarrhea, nausea, vomiting, abdominal pain.  Patient reprots that for the last 2 years she has presented persistent diarrhea episodes. She states she was seen by her PCP initially when her symptoms started and had stool testing. She does not bring any records with her but as far as I can retrieve from [facility] she had a positive stool testing for Campylobacter. Had negative testing for C. Difficile. She reports she received a course of antibiotics for 10 days.  The antibiotics slowed down the severity of the diarrhea to possibly 4-5 days a week but 2 months ago it worsened again to the point she is having daily diarrhea. She is currently having 4-5 bowel movements per day while taking taking Bentyl 10 mg twice a day, which is better to how it was before as she had it multiple times per day. Stool is usually soft but more recently watery. No melena or hematochezia. Has had multiple fecal soling per week, up to three times per week. She has had nighttime episodes. She takes 3-4 tablets of Imodium a day, but stopped after she started taking the Bentyl on Friday.  She states that it would help decreasing the frequency by 3 hours but then the diarrhea would come back. She has also presented intermittent cramping for the last two years, which improved after she vomited. Has been present every day for the last week. Also reports feeling frequent bloating.  For the last week she has presented  frequent vomiting on a daily, which was infrequent in the past.  The patient denies having any fever, chills, hematochezia, melena, hematemesis, jaundice, pruritus or weight loss.  The patient does not follow any type of diet.  Patient's states that sometimes she takes [facility] powders for joint pain.  2 months ago she started taking meloxicam but stopped taking it a month ago.  Last QPY:PPJKD Last Colonoscopy:never  FHx: neg for any gastrointestinal/liver disease, no malignancies Social: neg smoking, alcohol or illicit drug use Surgical: no abdominal surgeries  Past Medical History: Past Medical History:  Diagnosis Date   Carotid artery disease (HCC)    Essential hypertension    Fracture of radius, distal, right, closed 05/04/2015   GERD (gastroesophageal reflux disease)     Past Surgical History: Past Surgical History:  Procedure Laterality Date   OPEN REDUCTION INTERNAL FIXATION (ORIF) DISTAL RADIAL FRACTURE Right 05/04/2015   Procedure: OPEN REDUCTION INTERNAL FIXATION (ORIF) RIGHT DISTAL RADIUS FRACTURE;  Surgeon: Teryl Lucy, MD;  Location: [facility];  Service: Orthopedics;  Laterality: Right;   TONSILLECTOMY AND ADENOIDECTOMY      Family History: Family History  Problem Relation Age of Onset   Heart disease Mother        stents placed in heart, PPM   Diabetes Mother    Heart attack Mother    Heart disease Father    Heart attack Father    COPD Father    Diabetes Sister    Cancer Maternal Grandfather    Heart attack Paternal Grandfather 7       massive heart  attack    Social History: Social History   Tobacco Use  Smoking Status Never  Smokeless Tobacco Never   Social History   Substance and Sexual Activity  Alcohol Use Never   Social History   Substance and Sexual Activity  Drug Use Never    Allergies: No Known Allergies  Medications: Current Outpatient Medications  Medication Sig Dispense Refill   amLODipine (NORVASC) 5 MG  tablet Take 5 mg by mouth daily.     dicyclomine (BENTYL) 10 MG capsule Take 10 mg by mouth in the morning and at bedtime.     gabapentin (NEURONTIN) 300 MG capsule Take 300 mg by mouth at bedtime.     hydrALAZINE (APRESOLINE) 25 MG tablet Take 25 mg by mouth 3 (three) times daily.     hydrochlorothiazide (HYDRODIURIL) 25 MG tablet Take 25 mg by mouth every morning.     lisinopril (ZESTRIL) 40 MG tablet Take 40 mg by mouth daily.     meloxicam (MOBIC) 7.5 MG tablet Take 7.5 mg by mouth as needed for pain.     omeprazole (PRILOSEC) 40 MG capsule Take 1 capsule by mouth every morning.     No current facility-administered medications for this visit.    Review of Systems: GENERAL: negative for malaise, night sweats HEENT: No changes in hearing or vision, no nose bleeds or other nasal problems. NECK: Negative for lumps, goiter, pain and significant neck swelling RESPIRATORY: Negative for cough, wheezing CARDIOVASCULAR: Negative for chest pain, leg swelling, palpitations, orthopnea GI: SEE HPI MUSCULOSKELETAL: Negative for joint pain or swelling, back pain, and muscle pain. SKIN: Negative for lesions, rash PSYCH: Negative for sleep disturbance, mood disorder and recent psychosocial stressors. HEMATOLOGY Negative for prolonged bleeding, bruising easily, and swollen nodes. ENDOCRINE: Negative for cold or heat intolerance, polyuria, polydipsia and goiter. NEURO: negative for tremor, gait imbalance, syncope and seizures. The remainder of the review of systems is noncontributory.   Physical Exam: BP 133/67 (BP Location: Left Arm, Patient Position: Sitting, Cuff Size: Large)   Pulse 67   Temp 98.1 F (36.7 C) (Oral)   Ht 5\' 2"  (1.575 m)   Wt 142 lb 14.4 oz (64.8 kg)   BMI 26.14 kg/m  GENERAL: The patient is AO x3, in no acute distress. HEENT: Head is normocephalic and atraumatic. EOMI are intact. Mouth is well hydrated and without lesions. NECK: Supple. No masses LUNGS: Clear to  auscultation. No presence of rhonchi/wheezing/rales. Adequate chest expansion HEART: RRR, normal s1 and s2. ABDOMEN: very mildly tender in the epigastric area, no guarding, no peritoneal signs, and nondistended. BS +. No masses. EXTREMITIES: Without any cyanosis, clubbing, rash, lesions or edema. NEUROLOGIC: AOx3, no focal motor deficit. SKIN: no jaundice, no rashes   Imaging/Labs: as above  I personally reviewed and interpreted the available labs, imaging and endoscopic files.  Impression and Plan: NEVEEN DAPONTE is a 70 y.o. female with past medical history of carotid artery disease, hypertension, GERD, bradycardia, who presents for evaluation of chronic diarrhea, nausea, vomiting, abdominal pain.  The patient has presented multiple gastrointestinal complaints, being chronic diarrhea the main problem that has worsened especially during the last 2 months.  She has presented frequent fecal soiling and nocturnal accidents.  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  We will need to check serology for evaluation of thyroid dysfunction or celiac disease, but  he will be important to know what stool testing has been performed recently as I do not have access to the most recent stool tests performed a week ago.  Depending on which test were sent, will need to order further stool testing to rule out chronic gastrointestinal infections and pancreatic insufficiency.  It is unlikely her disease related to medications that she is not taking supplements that could cause it and is very unlikely this is related to lisinopril.  I explained to the patient that given the chronicity of her symptoms it would be important to explore it further with an EGD and colonoscopy, she would like to hold off on a colonoscopy unless strictly necessary as she  believes she will be able to tolerate the prep at all and that is why she has never had one in the past.  We may need to discuss this again in the future.  For now, she can continue taking the Bentyl twice a day and Imodium as needed for the diarrhea episodes, but also she can take Zofran as needed for the episode of nausea.  Will benefit as well from avoiding taking any NSAIDs or high-dose aspirin components.  -Schedule EGD  -Check tSH and celiac serology -Avoid using NSAIDs such as Goody powders, BC powders, Aleve, ibuprofen, naproxen, Motrin, Voltaren or Advil (even the topical ones) -Can continue Imodium as needed for diarrhea episodes -Continue with dicyclomine 10 mg twice a day -Patient will request to fax results of recent stool testing -May need to proceed with colonoscopy is testing is negative for any specific disorder, may need to add some other stool tests as well -Can take Zofran as needed for nausea - RTC 3 months  All questions were answered.      Katrinka Blazing, MD Gastroenterology and Hepatology [facility]

## 2020-11-08 NOTE — Patient Instructions (Addendum)
Schedule EGD  Perform blood workup Avoid using NSAIDs such as Goody poweders, BC powders, Aleve, ibuprofen, naproxen, Motrin, Voltaren or Advil (even the topical ones) Can continue Imodium as needed for diarrhea episodes Continue with dicyclomine 10 mg twice a day Please fax results of stool testing May need to proceed with colonoscopy is testing is negative for any specific disorder, may need to add some other stool tests as well Can take Zofran as needed for nausea

## 2020-11-09 LAB — CELIAC DISEASE PANEL
(tTG) Ab, IgA: <1 U/mL
(tTG) Ab, IgG: <1 U/mL
Gliadin IgA: <1 U/mL
Gliadin IgG: <1 U/mL
Immunoglobulin A: 142 mg/dL (ref 70–320)

## 2020-11-09 LAB — TSH: TSH: 2.14 mIU/L (ref 0.40–4.50)

## 2020-11-10 NOTE — Patient Instructions (Addendum)
Katie Chan  11/10/2020     @PREFPERIOPPHARMACY @   Your procedure is scheduled on 11/16/20.  Report to 11/18/20 at [phone] A.M.  Call this number if you have problems the morning of surgery:  [phone]   Remember:    Take these medicines the morning of surgery with A SIP OF WATER norvasc, neurontin, prilosec & hydralazine    Do not wear jewelry, make-up or nail polish.  Do not wear lotions, powders, or perfumes, or deodorant.  Do not shave 48 hours prior to surgery.  Men may shave face and neck.  Do not bring valuables to the hospital.  [facility] is not responsible for any belongings or valuables.  Contacts, dentures or bridgework may not be worn into surgery.  Leave your suitcase in the car.  After surgery it may be brought to your room.  For patients admitted to the hospital, discharge time will be determined by your treatment team.  Patients discharged the day of surgery will not be allowed to drive home.   Name and phone number of your driver:   family Special instructions:  follow any diet instructions that were provided to you from the doctor's office  Please read over the following fact sheets that you were given. Anesthesia Post-op Instructions and Care and Recovery After Surgery      PATIENT INSTRUCTIONS POST-ANESTHESIA  IMMEDIATELY FOLLOWING SURGERY:  Do not drive or operate machinery for the first twenty four hours after surgery.  Do not make any important decisions for twenty four hours after surgery or while taking narcotic pain medications or sedatives.  If you develop intractable nausea and vomiting or a severe headache please notify your doctor immediately.  FOLLOW-UP:  Please make an appointment with your surgeon as instructed. You do not need to follow up with anesthesia unless specifically instructed to do so.  WOUND CARE INSTRUCTIONS (if applicable):  Keep a dry clean dressing on the anesthesia/puncture wound site if there is drainage.  Once  the wound has quit draining you may leave it open to air.  Generally you should leave the bandage intact for twenty four hours unless there is drainage.  If the epidural site drains for more than 36-48 hours please call the anesthesia department.  QUESTIONS?:  Please feel free to call your physician or the hospital operator if you have any questions, and they will be happy to assist you.      Monitored Anesthesia Care, Care After This sheet gives you information about how to care for yourself after your procedure. Your health care provider may also give you more specific instructions. If you have problems or questions, contact your health care provider. What can I expect after the procedure? After the procedure, it is common to have: Tiredness. Forgetfulness about what happened after the procedure. Impaired judgment for important decisions. Nausea or vomiting. Some difficulty with balance. Follow these instructions at home: For the time period you were told by your health care provider:   Rest as needed. Do not participate in activities where you could fall or become injured. Do not drive or use machinery. Do not drink alcohol. Do not take sleeping pills or medicines that cause drowsiness. Do not make important decisions or sign legal documents. Do not take care of children on your own. Eating and drinking Follow the diet that is recommended by your health care provider. Drink enough fluid to keep your urine pale yellow. If you vomit: Drink water, juice, or soup when  you can drink without vomiting. Make sure you have little or no nausea before eating solid foods. General instructions Have a responsible adult stay with you for the time you are told. It is important to have someone help care for you until you are awake and alert. Take over-the-counter and prescription medicines only as told by your health care provider. If you have sleep apnea, surgery and certain medicines can  increase your risk for breathing problems. Follow instructions from your health care provider about wearing your sleep device: Anytime you are sleeping, including during daytime naps. While taking prescription pain medicines, sleeping medicines, or medicines that make you drowsy. Avoid smoking. Keep all follow-up visits as told by your health care provider. This is important. Contact a health care provider if: You keep feeling nauseous or you keep vomiting. You feel light-headed. You are still sleepy or having trouble with balance after 24 hours. You develop a rash. You have a fever. You have redness or swelling around the IV site. Get help right away if: You have trouble breathing. You have new-onset confusion at home. Summary For several hours after your procedure, you may feel tired. You may also be forgetful and have poor judgment. Have a responsible adult stay with you for the time you are told. It is important to have someone help care for you until you are awake and alert. Rest as told. Do not drive or operate machinery. Do not drink alcohol or take sleeping pills. Get help right away if you have trouble breathing, or if you suddenly become confused. This information is not intended to replace advice given to you by your health care provider. Make sure you discuss any questions you have with your health care provider. Document Revised: 09/25/2019 Document Reviewed: 12/12/2018 Elsevier Patient Education  2022 [employer]  Upper Endoscopy, Adult, Care After This sheet gives you information about how to care for yourself after your procedure. Your health care provider may also give you more specific instructions. If you have problems or questions, contact your health care provider. What can I expect after the procedure? After the procedure, it is common to have: A sore throat. Mild stomach pain or discomfort. Bloating. Nausea. Follow these instructions at home:  Follow  instructions from your health care provider about what to eat or drink after your procedure. Return to your normal activities as told by your health care provider. Ask your health care provider what activities are safe for you. Take over-the-counter and prescription medicines only as told by your health care provider. If you were given a sedative during the procedure, it can affect you for several hours. Do not drive or operate machinery until your health care provider says that it is safe. Keep all follow-up visits as told by your health care provider. This is important. Contact a health care provider if you have: A sore throat that lasts longer than one day. Trouble swallowing. Get help right away if: You vomit blood or your vomit looks like coffee grounds. You have: A fever. Bloody, black, or tarry stools. A severe sore throat or you cannot swallow. Difficulty breathing. Severe pain in your chest or abdomen. Summary After the procedure, it is common to have a sore throat, mild stomach discomfort, bloating, and nausea. If you were given a sedative during the procedure, it can affect you for several hours. Do not drive or operate machinery until your health care provider says that it is safe. Follow instructions from your health care provider  about what to eat or drink after your procedure. Return to your normal activities as told by your health care provider. This information is not intended to replace advice given to you by your health care provider. Make sure you discuss any questions you have with your health care provider. Document Revised: 01/07/2019 Document Reviewed: 06/11/2017 Elsevier Patient Education  2022 [employer] Upper Endoscopy, Adult Upper endoscopy is a procedure to look inside the upper GI (gastrointestinal) tract. The upper GI tract is made up of: The part of the body that moves food from your mouth to your stomach (esophagus). The stomach. The first part of your  small intestine (duodenum). This procedure is also called esophagogastroduodenoscopy (EGD) or gastroscopy. In this procedure, your health care provider passes a thin, flexible tube (endoscope) through your mouth and down your esophagus into your stomach. A small camera is attached to the end of the tube. Images from the camera appear on a monitor in the exam room. During this procedure, your health care provider may also remove a small piece of tissue to be sent to a lab and examined under a microscope (biopsy). Your health care provider may do an upper endoscopy to diagnose cancers of the upper GI tract. You may also have this procedure to find the cause of other conditions, such as: Stomach pain. Heartburn. Pain or problems when swallowing. Nausea and vomiting. Stomach bleeding. Stomach ulcers. Tell a health care provider about: Any allergies you have. All medicines you are taking, including vitamins, herbs, eye drops, creams, and over-the-counter medicines. Any problems you or family members have had with anesthetic medicines. Any blood disorders you have. Any surgeries you have had. Any medical conditions you have. Whether you are pregnant or may be pregnant. What are the risks? Generally, this is a safe procedure. However, problems may occur, including: Infection. Bleeding. Allergic reactions to medicines. A tear or hole (perforation) in the esophagus, stomach, or duodenum. What happens before the procedure? Staying hydrated Follow instructions from your health care provider about hydration, which may include: Up to 2 hours before the procedure - you may continue to drink clear liquids, such as water, clear fruit juice, black coffee, and plain tea.  Eating and drinking restrictions Follow instructions from your health care provider about eating and drinking, which may include: 8 hours before the procedure - stop eating heavy meals or foods, such as meat, fried foods, or fatty  foods. 6 hours before the procedure - stop eating light meals or foods, such as toast or cereal. 6 hours before the procedure - stop drinking milk or drinks that contain milk. 2 hours before the procedure - stop drinking clear liquids. Medicines Ask your health care provider about: Changing or stopping your regular medicines. This is especially important if you are taking diabetes medicines or blood thinners. Taking medicines such as aspirin and ibuprofen. These medicines can thin your blood. Do not take these medicines unless your health care provider tells you to take them. Taking over-the-counter medicines, vitamins, herbs, and supplements. General instructions Plan to have someone take you home from the hospital or clinic. If you will be going home right after the procedure, plan to have someone with you for 24 hours. Ask your health care provider what steps will be taken to help prevent infection. What happens during the procedure?  An IV will be inserted into one of your veins. You may be given one or more of the following: A medicine to help you relax (sedative). A  medicine to numb the throat (local anesthetic). You will lie on your left side on an exam table. Your health care provider will pass the endoscope through your mouth and down your esophagus. Your health care provider will use the scope to check the inside of your esophagus, stomach, and duodenum. Biopsies may be taken. The endoscope will be removed. The procedure may vary among health care providers and hospitals. What happens after the procedure? Your blood pressure, heart rate, breathing rate, and blood oxygen level will be monitored until you leave the hospital or clinic. Do not drive for 24 hours if you were given a sedative during your procedure. When your throat is no longer numb, you may be given some fluids to drink. It is up to you to get the results of your procedure. Ask your health care provider, or the  department that is doing the procedure, when your results will be ready. Summary Upper endoscopy is a procedure to look inside the upper GI tract. During the procedure, an IV will be inserted into one of your veins. You may be given a medicine to help you relax. A medicine will be used to numb your throat. The endoscope will be passed through your mouth and down your esophagus. This information is not intended to replace advice given to you by your health care provider. Make sure you discuss any questions you have with your health care provider. Document Revised: 07/04/2017 Document Reviewed: 06/11/2017 Elsevier Patient Education  2022 [employer].

## 2020-11-11 ENCOUNTER — Other Ambulatory Visit: Payer: Self-pay

## 2020-11-11 ENCOUNTER — Encounter (HOSPITAL_COMMUNITY): Payer: Self-pay

## 2020-11-11 ENCOUNTER — Encounter (HOSPITAL_COMMUNITY)
Admission: RE | Admit: 2020-11-11 | Discharge: 2020-11-11 | Disposition: A | Discharge status: Home / Self Care | Payer: Medicare Other | Source: Ambulatory Visit | Attending: Gastroenterology | Admitting: Gastroenterology

## 2020-11-11 ENCOUNTER — Telehealth (INDEPENDENT_AMBULATORY_CARE_PROVIDER_SITE_OTHER): Payer: Self-pay

## 2020-11-11 DIAGNOSIS — R1013 Epigastric pain: Secondary | ICD-10-CM | POA: Insufficient documentation

## 2020-11-11 DIAGNOSIS — G8929 Other chronic pain: Secondary | ICD-10-CM | POA: Diagnosis not present

## 2020-11-11 DIAGNOSIS — K529 Noninfective gastroenteritis and colitis, unspecified: Secondary | ICD-10-CM | POA: Diagnosis not present

## 2020-11-11 DIAGNOSIS — Z01818 Encounter for other preprocedural examination: Secondary | ICD-10-CM | POA: Diagnosis not present

## 2020-11-11 HISTORY — DX: Nausea with vomiting, unspecified: R11.2

## 2020-11-11 LAB — BASIC METABOLIC PANEL
Anion gap: 12 (ref 5–15)
BUN: 23 mg/dL (ref 8–23)
CO2: 23 mmol/L (ref 22–32)
Calcium: 8.6 mg/dL — ABNORMAL LOW (ref 8.9–10.3)
Chloride: 104 mmol/L (ref 98–111)
Creatinine, Ser: 1.13 mg/dL — ABNORMAL HIGH (ref 0.44–1.00)
GFR, Estimated: 53 mL/min — ABNORMAL LOW (ref >60)
Glucose, Bld: 79 mg/dL (ref 70–99)
Potassium: 3.5 mmol/L (ref 3.5–5.1)
Sodium: 139 mmol/L (ref 135–145)

## 2020-11-16 ENCOUNTER — Ambulatory Visit (HOSPITAL_COMMUNITY)
Admission: RE | Admit: 2020-11-16 | Discharge: 2020-11-16 | Disposition: A | Discharge status: Home / Self Care | Payer: Medicare Other | Attending: Gastroenterology | Admitting: Gastroenterology

## 2020-11-16 ENCOUNTER — Encounter (HOSPITAL_COMMUNITY): Payer: Self-pay | Admitting: Gastroenterology

## 2020-11-16 ENCOUNTER — Encounter (HOSPITAL_COMMUNITY)
Admission: RE | Disposition: A | Discharge status: Home / Self Care | Payer: Self-pay | Source: Home / Self Care | Attending: Gastroenterology

## 2020-11-16 ENCOUNTER — Encounter (INDEPENDENT_AMBULATORY_CARE_PROVIDER_SITE_OTHER): Payer: Self-pay

## 2020-11-16 ENCOUNTER — Ambulatory Visit (HOSPITAL_COMMUNITY): Payer: Medicare Other | Admitting: Anesthesiology

## 2020-11-16 DIAGNOSIS — I10 Essential (primary) hypertension: Secondary | ICD-10-CM | POA: Insufficient documentation

## 2020-11-16 DIAGNOSIS — K529 Noninfective gastroenteritis and colitis, unspecified: Secondary | ICD-10-CM | POA: Insufficient documentation

## 2020-11-16 DIAGNOSIS — R197 Diarrhea, unspecified: Secondary | ICD-10-CM | POA: Diagnosis not present

## 2020-11-16 DIAGNOSIS — Z79899 Other long term (current) drug therapy: Secondary | ICD-10-CM | POA: Insufficient documentation

## 2020-11-16 DIAGNOSIS — K315 Obstruction of duodenum: Secondary | ICD-10-CM | POA: Insufficient documentation

## 2020-11-16 DIAGNOSIS — Z791 Long term (current) use of non-steroidal anti-inflammatories (NSAID): Secondary | ICD-10-CM | POA: Diagnosis not present

## 2020-11-16 DIAGNOSIS — R112 Nausea with vomiting, unspecified: Secondary | ICD-10-CM | POA: Insufficient documentation

## 2020-11-16 DIAGNOSIS — K219 Gastro-esophageal reflux disease without esophagitis: Secondary | ICD-10-CM | POA: Insufficient documentation

## 2020-11-16 DIAGNOSIS — R1013 Epigastric pain: Secondary | ICD-10-CM | POA: Diagnosis not present

## 2020-11-16 HISTORY — PX: ESOPHAGOGASTRODUODENOSCOPY (EGD) WITH PROPOFOL: SHX5813

## 2020-11-16 HISTORY — PX: BALLOON DILATION: SHX5330

## 2020-11-16 SURGERY — ESOPHAGOGASTRODUODENOSCOPY (EGD) WITH PROPOFOL
Anesthesia: General

## 2020-11-16 MED ORDER — LACTATED RINGERS IV SOLN: INTRAVENOUS | Status: DC

## 2020-11-16 MED ORDER — LIDOCAINE HCL (CARDIAC) PF 100 MG/5ML IV SOSY
PREFILLED_SYRINGE | INTRAVENOUS | Status: DC | PRN
  Administered 2020-11-16: 80 mg via INTRAVENOUS

## 2020-11-16 MED ORDER — PROPOFOL 10 MG/ML IV BOLUS
INTRAVENOUS | Status: DC | PRN
  Administered 2020-11-16: 50 mg via INTRAVENOUS
  Administered 2020-11-16: 100 mg via INTRAVENOUS
  Administered 2020-11-16 (×2): 50 mg via INTRAVENOUS
  Administered 2020-11-16: 30 mg via INTRAVENOUS
  Administered 2020-11-16: 50 mg via INTRAVENOUS
  Administered 2020-11-16 (×2): 30 mg via INTRAVENOUS
  Administered 2020-11-16: 20 mg via INTRAVENOUS
  Administered 2020-11-16: 30 mg via INTRAVENOUS
  Administered 2020-11-16: 50 mg via INTRAVENOUS
  Administered 2020-11-16: 80 mg via INTRAVENOUS

## 2020-11-16 MED ORDER — GLYCOPYRROLATE PF 0.2 MG/ML IJ SOSY
PREFILLED_SYRINGE | INTRAMUSCULAR | Status: DC | PRN
  Administered 2020-11-16: .1 mg via INTRAVENOUS

## 2020-11-16 NOTE — Interval H&P Note (Signed)
History and Physical Interval Note:  11/16/2020 7:26 AM Katie Chan is a 70 y.o. female with past medical history of carotid artery disease, hypertension, GERD, bradycardia, who presents for evaluation of chronic diarrhea, nausea, vomiting, abdominal pain.  Patient had stool testing recently and was told results were normal but I do not have these records. She stated the reports will be faxed.  BP (!) 141/63   Pulse (!) 43   Temp 97.7 F (36.5 C) (Oral)   Resp 18   SpO2 97%  GENERAL: The patient is AO x3, in no acute distress. HEENT: Head is normocephalic and atraumatic. EOMI are intact. Mouth is well hydrated and without lesions. NECK: Supple. No masses LUNGS: Clear to auscultation. No presence of rhonchi/wheezing/rales. Adequate chest expansion HEART: RRR, normal s1 and s2. ABDOMEN: Soft, nontender, no guarding, no peritoneal signs, and nondistended. BS +. No masses. EXTREMITIES: Without any cyanosis, clubbing, rash, lesions or edema. NEUROLOGIC: AOx3, no focal motor deficit. SKIN: no jaundice, no rashes   Katie Chan  has presented today for surgery, with the diagnosis of Epigastric Pain Chronic Diarrhea.  The various methods of treatment have been discussed with the patient and family. After consideration of risks, benefits and other options for treatment, the patient has consented to  Procedure(s) with comments: ESOPHAGOGASTRODUODENOSCOPY (EGD) WITH PROPOFOL (N/A) - 7:30 as a surgical intervention.  The patient's history has been reviewed, patient examined, no change in status, stable for surgery.  I have reviewed the patient's chart and labs.  Questions were answered to the patient's satisfaction.     Katrinka Blazing Mayorga

## 2020-11-16 NOTE — Anesthesia Postprocedure Evaluation (Signed)
Anesthesia Post Note  Patient: Katie Chan  Procedure(s) Performed: ESOPHAGOGASTRODUODENOSCOPY (EGD) WITH PROPOFOL BALLOON DILATION  Patient location during evaluation: Phase II Anesthesia Type: General Level of consciousness: awake and alert and oriented Pain management: pain level controlled Vital Signs Assessment: post-procedure vital signs reviewed and stable Respiratory status: spontaneous breathing, nonlabored ventilation and respiratory function stable Cardiovascular status: blood pressure returned to baseline and stable Postop Assessment: no apparent nausea or vomiting Anesthetic complications: no   No notable events documented.   Last Vitals:  Vitals:   11/16/20 0806 11/16/20 0820  BP: (!) 95/45 107/70  Pulse: 72   Resp: 16   Temp: 36.5 C   SpO2: 98%     Last Pain:  Vitals:   11/16/20 0820  TempSrc:   PainSc: 0-No pain                 Rashad Auld C Lavontae Cornia

## 2020-11-16 NOTE — Transfer of Care (Signed)
Immediate Anesthesia Transfer of Care Note  Patient: Katie Chan  Procedure(s) Performed: ESOPHAGOGASTRODUODENOSCOPY (EGD) WITH PROPOFOL BALLOON DILATION  Patient Location: Short Stay  Anesthesia Type:General  Level of Consciousness: drowsy, patient cooperative and responds to stimulation  Airway & Oxygen Therapy: Patient Spontanous Breathing  Post-op Assessment: Report given to RN, Post -op Vital signs reviewed and stable and Patient moving all extremities X 4  Post vital signs: Reviewed and stable  Last Vitals:  Vitals Value Taken Time  BP    Temp    Pulse    Resp    SpO2      Last Pain:  Vitals:   11/16/20 0727  TempSrc:   PainSc: 0-No pain      Patients Stated Pain Goal: 5 (11/16/20 0641)  Complications: No notable events documented.

## 2020-11-16 NOTE — Discharge Instructions (Addendum)
You are being discharged to home.  Resume your previous diet - eat smaller portions multiple times a day. Do not take any ibuprofen (including meloxicam, high dose aspirin, Goody/BC powders, Advil, Motrin or Nuprin), naproxen, or other non-steroidal anti-inflammatory drugs.

## 2020-11-16 NOTE — Anesthesia Preprocedure Evaluation (Signed)
Anesthesia Evaluation  Patient identified by MRN, date of birth, ID band Patient awake    Reviewed: Allergy & Precautions, NPO status , Patient's Chart, lab work & pertinent test results  History of Anesthesia Complications (+) PONV and history of anesthetic complications  Airway Mallampati: II  TM Distance: >3 FB Neck ROM: Full    Dental  (+) Dental Advisory Given, Missing, Partial Lower   Pulmonary neg pulmonary ROS,    Pulmonary exam normal breath sounds clear to auscultation       Cardiovascular Exercise Tolerance: Good hypertension, Pt. on medications + Peripheral Vascular Disease   Rhythm:Regular Rate:Bradycardia - Systolic murmurs, - Diastolic murmurs, - Friction Rub, - Carotid Bruit and - Systolic Click    Neuro/Psych negative neurological ROS  negative psych ROS   GI/Hepatic Neg liver ROS, GERD  Medicated and Controlled,  Endo/Other  negative endocrine ROS  Renal/GU negative Renal ROS  negative genitourinary   Musculoskeletal negative musculoskeletal ROS (+)   Abdominal   Peds negative pediatric ROS (+)  Hematology negative hematology ROS (+)   Anesthesia Other Findings   Reproductive/Obstetrics negative OB ROS                            Anesthesia Physical Anesthesia Plan  ASA: 2  Anesthesia Plan: General   Post-op Pain Management:    Induction: Intravenous  PONV Risk Score and Plan: TIVA  Airway Management Planned: Nasal Cannula and Natural Airway  Additional Equipment:   Intra-op Plan:   Post-operative Plan:   Informed Consent: I have reviewed the patients History and Physical, chart, labs and discussed the procedure including the risks, benefits and alternatives for the proposed anesthesia with the patient or authorized representative who has indicated his/her understanding and acceptance.     Dental advisory given  Plan Discussed with: CRNA and  Surgeon  Anesthesia Plan Comments:         Anesthesia Quick Evaluation

## 2020-11-16 NOTE — Op Note (Signed)
[facility] Patient Name: Katie Chan Procedure Date: 11/16/2020 7:19 AM MRN: [id number] Date of Birth: 12/04/1950 Attending MD: Katrinka Blazing ,  CSN: [id number] Age: 70 Admit Type: Outpatient Procedure:                Upper GI endoscopy Indications:              Epigastric abdominal pain, Diarrhea, Nausea with                            vomiting Providers:                Katrinka Blazing, Crystal Page, Pandora Leiter,                            Technician Referring MD:              Medicines:                Monitored Anesthesia Care Complications:            No immediate complications. Estimated Blood Loss:     Estimated blood loss: none. Procedure:                Pre-Anesthesia Assessment:                           - Prior to the procedure, a History and Physical                            was performed, and patient medications, allergies                            and sensitivities were reviewed. The patient's                            tolerance of previous anesthesia was reviewed.                           - The risks and benefits of the procedure and the                            sedation options and risks were discussed with the                            patient. All questions were answered and informed                            consent was obtained.                           After obtaining informed consent, the endoscope was                            passed under direct vision. Throughout the                            procedure, the patient's blood pressure, pulse, and  oxygen saturations were monitored continuously. The                            GIF-H190 ([id number]) scope was introduced through the                            mouth, and advanced to the duodenal bulb. The upper                            GI endoscopy was accomplished without difficulty.                            The patient tolerated the procedure well. The                             GIF-[id number] ([id number]) scope was introduced through                            the mouth, and advanced to the second part of                            duodenum. Scope In: 7:32:00 AM Scope Out: 8:01:35 AM Total Procedure Duration: 0 hours 29 minutes 35 seconds  Findings:      The examined esophagus was normal.      A small amount of food (residue) was found in the gastric body.      The exam of the stomach was otherwise normal.      Food (residue) was found in the duodenal bulb.      An acquired benign-appearing, intrinsic severe stenosis was found in the       first portion of the duodenum and was traversed with the ultraslim       scope. No alterations were found upon inspection of the second portion       of the duodenum. A TTS dilator was passed through the scope. Dilation       with an 08-31-08 mm balloon dilator was performed to 10 mm. Impression:               - Normal esophagus.                           - A small amount of food (residue) in the stomach.                           - Retained food in the duodenum.                           - Acquired duodenal stricture. Dilated.                           - No specimens collected. Moderate Sedation:      Per Anesthesia Care Recommendation:           - Discharge patient to home (ambulatory).                           - Resume previous  diet - eat smaller portions                            multiple times a day.                           - No meloxicam, high dose aspirin, Goody/BC                            powders, ibuprofen, naproxen, or other                            non-steroidal anti-inflammatory drugs.                           - Can consider repeat EGD if persistent nausea. Procedure Code(s):        --- Professional ---                           [phone], Esophagogastroduodenoscopy, flexible,                            transoral; with dilation of gastric/duodenal                            stricture(s) (eg,  balloon, bougie) Diagnosis Code(s):        --- Professional ---                           K31.5, Obstruction of duodenum                           R10.13, Epigastric pain                           R19.7, Diarrhea, unspecified                           R11.2, Nausea with vomiting, unspecified CPT copyright 2019 American Medical Association. All rights reserved. The codes documented in this report are preliminary and upon coder review may  be revised to meet current compliance requirements. Katrinka Blazing, MD Katrinka Blazing,  11/16/2020 8:12:57 AM This report has been signed electronically. Number of Addenda: 0

## 2020-11-18 ENCOUNTER — Encounter (HOSPITAL_COMMUNITY): Payer: Self-pay | Admitting: Gastroenterology

## 2021-02-28 ENCOUNTER — Ambulatory Visit (INDEPENDENT_AMBULATORY_CARE_PROVIDER_SITE_OTHER): Payer: Medicare Other | Admitting: Gastroenterology

## 2021-03-01 ENCOUNTER — Encounter (INDEPENDENT_AMBULATORY_CARE_PROVIDER_SITE_OTHER): Payer: Self-pay | Admitting: Internal Medicine

## 2021-03-01 ENCOUNTER — Other Ambulatory Visit: Payer: Self-pay

## 2021-03-01 ENCOUNTER — Ambulatory Visit (INDEPENDENT_AMBULATORY_CARE_PROVIDER_SITE_OTHER): Payer: Medicare Other | Admitting: Internal Medicine

## 2021-03-01 DIAGNOSIS — Z1212 Encounter for screening for malignant neoplasm of rectum: Secondary | ICD-10-CM

## 2021-03-01 DIAGNOSIS — K219 Gastro-esophageal reflux disease without esophagitis: Secondary | ICD-10-CM

## 2021-03-01 DIAGNOSIS — K529 Noninfective gastroenteritis and colitis, unspecified: Secondary | ICD-10-CM | POA: Diagnosis not present

## 2021-03-01 DIAGNOSIS — Z1211 Encounter for screening for malignant neoplasm of colon: Secondary | ICD-10-CM | POA: Diagnosis not present

## 2021-03-01 MED ORDER — OMEPRAZOLE 20 MG PO CPDR: 20.0000 mg | DELAYED_RELEASE_CAPSULE | Freq: Every day | ORAL | 3 refills | Status: DC

## 2021-03-01 NOTE — Progress Notes (Signed)
Virtual Visit via Telephone Note  I connected with Katie Chan on 03/01/21 at 12:30 PM EST by telephone and verified that I am speaking with the correct person using two identifiers.  Location: Patient: home Provider: office   I discussed the limitations, risks, security and privacy concerns of performing an evaluation and management service by telephone and the availability of in person appointments. I also discussed with the patient that there may be a patient responsible charge related to this service. The patient expressed understanding and agreed to proceed.   History of Present Illness:  Patient is 71 year old Caucasian female who is returning for follow-up visit.  She was initially seen by Dr. Levon Hedger in October 2022 for nausea vomiting epigastric pain as well as chronic diarrhea.  Diarrhea was felt to be due to IBS as dicyclomine had been working.  TSH and celiac antibody panel were negative.  She underwent esophagogastroduodenoscopy on 11/16/2020.  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 Screening colonoscopy was recommended but she declined.  Patient says she is doing very well.  She is having formed stools.  On most days she has 1-2 stools.  However every now and then she can skip a day or 2 days.  She is not having any side effects with dicyclomine.  She denies melena or rectal bleeding or abdominal pain.  She has not had any urgency or accidents. Her appetite is good and her weight has been stable.  She says heartburn is well controlled with therapy. Her family history is negative for colorectal carcinoma.  She says she had colonoscopy several years ago but she is not interested in having another exam but she is interested in having Cologuard test for screening.    Current Outpatient Medications:    amLODipine (NORVASC) 5 MG tablet, Take 5 mg by mouth daily., Disp: , Rfl:    dicyclomine  (BENTYL) 10 MG capsule, Take 10 mg by mouth in the morning and at bedtime., Disp: , Rfl:    gabapentin (NEURONTIN) 300 MG capsule, Take 300 mg by mouth at bedtime., Disp: , Rfl:    hydrALAZINE (APRESOLINE) 25 MG tablet, Take 25 mg by mouth 3 (three) times daily., Disp: , Rfl:    hydrochlorothiazide (HYDRODIURIL) 25 MG tablet, Take 25 mg by mouth every morning., Disp: , Rfl:    lisinopril (ZESTRIL) 40 MG tablet, Take 40 mg by mouth daily., Disp: , Rfl:    omeprazole (PRILOSEC) 40 MG capsule, Take 1 capsule (20 mg total) by mouth daily.,    ondansetron (ZOFRAN) 4 MG tablet, Take 1 tablet (4 mg total) by mouth every 8 (eight) hours as needed for nausea or vomiting., Disp: 30 tablet, Rfl: 1   OVER THE COUNTER MEDICATION, Vit b 12 one daily, Disp: , Rfl:   Observations/Objective:  Patient reported her weight to be 142 pounds. She weighed 142 pounds on 11/08/2020.   Assessment and Plan:  #1.  Irritable bowel syndrome/diarrhea.  She is doing well with low-dose dicyclomine.  She can take 1 dose on days when she is home and 2 doses when she is working.  #2.  Chronic GERD.  She is doing well with omeprazole 40 mg daily.  Will decrease dose to 20 mg and she how she does.  #3.  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  Stricture was dilated to 10 mm with a balloon in October  2022.  She is not having any symptoms suggest restenosis.  #4.  Patient is average risk for CRC.  She is not interested in colonoscopy but agreeable to proceed with Cologuard test.    Follow Up Instructions:  Decrease omeprazole to 20 mg by mouth 30 minutes before breakfast daily.  If low dose does not control heartburn she will call office. Cologuard test. Office visit in 1 year.  I discussed the assessment and treatment plan with the patient. The patient was provided an opportunity to ask questions and all were answered. The patient agreed with the plan and demonstrated an understanding of  the instructions.   The patient was advised to call back or seek an in-person evaluation if the symptoms worsen or if the condition fails to improve as anticipated.  I provided 12 minutes of non-face-to-face time during this encounter.   Lionel December, MD

## 2021-03-01 NOTE — Patient Instructions (Signed)
Physician will call with result of cologuard test when completed. Notify if lower dose of omeprazole does not work

## 2021-05-05 LAB — COLOGUARD: COLOGUARD: NEGATIVE

## 2021-05-25 ENCOUNTER — Encounter: Payer: Self-pay | Admitting: Cardiology

## 2021-05-25 NOTE — Progress Notes (Signed)
? ? ?Cardiology Office Note ? ?Date: 05/26/2021  ? ?ID: Katie Chan, DOB [birth year], MRN [id number] ? ?PCP:  Katie Sabal, PA  ?Cardiologist:  Katie Dell, MD ?Electrophysiologist:  None  ? ?Chief Complaint  ?Patient presents with  ? Cardiac follow-up  ? ? ?History of Present Illness: ?Katie Chan is a 71 y.o. female former patient of Dr. Purvis Chan last seen in September 2021 by Mr. Vincenza Hews NP.  She presents to establish follow-up with me, this is our first meeting.  I reviewed her records and updated the chart. ? ?She presents mainly with concerns for follow-up of carotid artery disease.  She has had no focal motor weakness, visual changes.  Has occasional headaches.  Her last carotid Dopplers were in 2021 at which point she had mild to moderate ICA stenosis, right greater than left.  She is not on aspirin or statin therapy. ? ?I reviewed her medications.  She continues to follow with Ms. [employer].  No recent lipids. ? ?Past Medical History:  ?Diagnosis Date  ? Carotid artery disease (HCC)   ? Essential hypertension   ? Fracture of radius, distal, right, closed 05/04/2015  ? GERD (gastroesophageal reflux disease)   ? ? ?Past Surgical History:  ?Procedure Laterality Date  ? BALLOON DILATION  11/16/2020  ? Procedure: BALLOON DILATION;  Surgeon: Marguerita Merles, Reuel Boom, MD;  [facility];  Service: Gastroenterology;;  ? ESOPHAGOGASTRODUODENOSCOPY (EGD) WITH PROPOFOL N/A 11/16/2020  ? Procedure: ESOPHAGOGASTRODUODENOSCOPY (EGD) WITH PROPOFOL;  Surgeon: Dolores Frame, MD;  [facility]: [facility];  Service: Gastroenterology;  Laterality: N/A;  7:30  ? OPEN REDUCTION INTERNAL FIXATION (ORIF) DISTAL RADIAL FRACTURE Right 05/04/2015  ? Procedure: OPEN REDUCTION INTERNAL FIXATION (ORIF) RIGHT DISTAL RADIUS FRACTURE;  Surgeon: Teryl Lucy, MD;  Location: [facility];  Service: Orthopedics;  Laterality: Right;  ? TONSILLECTOMY AND ADENOIDECTOMY    ? ? ?Current Outpatient  Medications  ?Medication Sig Dispense Refill  ? amLODipine (NORVASC) 5 MG tablet Take 5 mg by mouth daily.    ? aspirin EC 81 MG tablet Take 1 tablet (81 mg total) by mouth daily. Swallow whole. 90 tablet 3  ? dicyclomine (BENTYL) 10 MG capsule Take 10 mg by mouth in the morning and at bedtime.    ? gabapentin (NEURONTIN) 300 MG capsule Take 300 mg by mouth at bedtime.    ? hydrALAZINE (APRESOLINE) 25 MG tablet Take 25 mg by mouth 3 (three) times daily.    ? hydrochlorothiazide (HYDRODIURIL) 25 MG tablet Take 25 mg by mouth every morning.    ? lisinopril (ZESTRIL) 40 MG tablet Take 40 mg by mouth daily.    ? omeprazole (PRILOSEC) 20 MG capsule Take 1 capsule (20 mg total) by mouth daily. 90 capsule 3  ? ondansetron (ZOFRAN) 4 MG tablet Take 1 tablet (4 mg total) by mouth every 8 (eight) hours as needed for nausea or vomiting. 30 tablet 1  ? OVER THE COUNTER MEDICATION Vit b 12 one daily    ? ?No current facility-administered medications for this visit.  ? ?Allergies:  Patient has no known allergies.  ? ?ROS: No palpitations or syncope. ? ?Physical Exam: ?VS:  BP (!) 158/70   Pulse (!) 56   Ht 5\' 2"  (1.575 m)   Wt 144 lb (65.3 kg)   SpO2 96%   BMI 26.34 kg/m? , BMI Body mass index is 26.34 kg/m?. ? ?Wt Readings from Last 3 Encounters:  ?05/26/21 144 lb (65.3 kg)  ?11/08/20  142 lb 14.4 oz (64.8 kg)  ?10/16/19 144 lb (65.3 kg)  ?  ?General: Patient appears comfortable at rest. ?HEENT: Conjunctiva and lids normal, oropharynx clear. ?Neck: Supple, no elevated JVP, bilateral carotid bruits. ?Lungs: Clear to auscultation, nonlabored breathing at rest. ?Cardiac: Regular rate and rhythm, no S3, 2/6 systolic murmur, no pericardial rub. ?Extremities: No pitting edema. ? ?ECG:  An ECG dated 11/11/2020 was personally reviewed today and demonstrated:  Sinus bradycardia with leftward axis and LVH. ? ?Recent Labwork: ?11/08/2020: TSH 2.14 ?11/11/2020: BUN 23; Creatinine, Ser 1.13; Potassium 3.5; Sodium 139  ? ?Other Studies  Reviewed Today: ? ?Echocardiogram 08/14/2019: ? 1. Left ventricular ejection fraction, by estimation, is 65 to 70%. The  ?left ventricle has normal function. The left ventricle has no regional  ?wall motion abnormalities. There is mild left ventricular hypertrophy.  ?Left ventricular diastolic parameters  ?were normal.  ? 2. Right ventricular systolic function is normal. The right ventricular  ?size is normal. There is normal pulmonary artery systolic pressure.  ? 3. The mitral valve is normal in structure. Trivial mitral valve  ?regurgitation. No evidence of mitral stenosis.  ? 4. The aortic valve is tricuspid. Aortic valve regurgitation is not  ?visualized. No aortic stenosis is present.  ? 5. The inferior vena cava is normal in size with greater than 50%  ?respiratory variability, suggesting right atrial pressure of 3 mmHg.  ? ?Lexiscan Myoview 08/14/2019: ?There was no ST segment deviation noted during stress. ?This is a low risk study. ?The left ventricular ejection fraction is hyperdynamic (>65%). ?Small mild intensity apical defect with mild reversibility likely secondary to apical thinning, cannot completely exclude mild apical ischemia. Either finding would support low risk. ? ?Cardiac monitor August 2021: ?14 day monitor ?Max HR 182, Min HR 24, Avg HR 61 ?Rare supraventricular ectopy in the form of isolated PACs, couplets, triplets. Short runs of SVT up to 7 beats ?Rare ventricular ectopy in the form of isolated PVCs, couplets ?No diary of symptoms reported ?Episode of wandering atrial pacemaker with 2.4 second sinus pause. ? ?Carotid Dopplers 09/25/2019: ?Summary:  ?Right Carotid: Velocities in the right ICA are consistent with a 40-59%  ?               stenosis.  ? ?Left Carotid: Velocities in the left ICA are consistent with a 1-39%  ?stenosis.  ? ?Vertebrals:  Bilateral vertebral arteries demonstrate antegrade flow.  ?Subclavians: Normal flow hemodynamics were seen in bilateral subclavian  ?              arteries.  ? ?Assessment and Plan: ? ?1.  Bilateral carotid artery disease, right greater than left ICA stenosis as of 2021.  We will obtain follow-up carotid Dopplers.  Starting aspirin 81 mg daily.  Check lipid panel with eye toward statin as well. ? ?2.  Systolic murmur, echocardiogram from 2021 revealed vigorous LVEF of 65 to 70% with mild LVH, no significant aortic stenosis. ? ?3.  Essential hypertension, currently on Norvasc, lisinopril, HCTZ and hydralazine.  Blood pressure elevated today.  Keep follow-up with PCP in case further adjustments are necessary. ? ?Medication Adjustments/Labs and Tests Ordered: ?Current medicines are reviewed at length with the patient today.  Concerns regarding medicines are outlined above.  ? ?Tests Ordered: ?Orders Placed This Encounter  ?Procedures  ? Lipid panel  ? Lipid panel  ? ? ?Medication Changes: ?Meds ordered this encounter  ?Medications  ? aspirin EC 81 MG tablet  ?  Sig: Take 1 tablet (  81 mg total) by mouth daily. Swallow whole.  ?  Dispense:  90 tablet  ?  Refill:  3  ?  05/26/21 New Start  ? ? ?Disposition:  Follow up  1 year. ? ?Signed, ?Jonelle Sidle, MD, [facility] ?05/26/2021 9:12 AM    ?[facility] ?[address] [place], [place], [place] [address] ?Phone: [phone]; Fax: [phone]  ?

## 2021-05-26 ENCOUNTER — Encounter: Payer: Self-pay | Admitting: Cardiology

## 2021-05-26 ENCOUNTER — Encounter: Payer: Self-pay | Admitting: *Deleted

## 2021-05-26 ENCOUNTER — Ambulatory Visit: Payer: Medicare Other | Admitting: Cardiology

## 2021-05-26 VITALS — BP 158/70 | HR 56 | Ht 62.0 in | Wt 144.0 lb

## 2021-05-26 DIAGNOSIS — I10 Essential (primary) hypertension: Secondary | ICD-10-CM | POA: Diagnosis not present

## 2021-05-26 DIAGNOSIS — I6523 Occlusion and stenosis of bilateral carotid arteries: Secondary | ICD-10-CM

## 2021-05-26 DIAGNOSIS — I779 Disorder of arteries and arterioles, unspecified: Secondary | ICD-10-CM | POA: Insufficient documentation

## 2021-05-26 MED ORDER — ASPIRIN EC 81 MG PO TBEC: 81.0000 mg | DELAYED_RELEASE_TABLET | Freq: Every day | ORAL | 3 refills | Status: DC

## 2021-05-26 NOTE — Addendum Note (Signed)
Addended by: Merlene Laughter on: 05/26/2021 09:39 AM ? ? Modules accepted: Orders ? ?

## 2021-05-26 NOTE — Patient Instructions (Addendum)
Medication Instructions:  ?Your physician has recommended you make the following change in your medication: ?Start aspirin 81 mg once a day ?Continue all medications as directed ? ?Labwork: ?Fasting lipid panel as soon as possible @ [facility] ? ?Testing/Procedures: ?Your physician has requested that you have a carotid duplex. This test is an ultrasound of the carotid arteries in your neck. It looks at blood flow through these arteries that supply the brain with blood. Allow one hour for this exam. There are no restrictions or special instructions.  ? ?Follow-Up: ?Your physician recommends that you schedule a follow-up appointment in: 1 year ? ?Any Other Special Instructions Will Be Listed Below (If Applicable). ? ?You will receive a reminder call in about 10 months reminding you to call and schedule your appointment. If you don't receive this call, please contact our office. ? ? ?If you need a refill on your cardiac medications before your next appointment, please call your pharmacy.  ? ?

## 2021-06-22 ENCOUNTER — Other Ambulatory Visit: Payer: Self-pay | Admitting: Cardiology

## 2021-06-22 DIAGNOSIS — I6523 Occlusion and stenosis of bilateral carotid arteries: Secondary | ICD-10-CM

## 2021-06-23 ENCOUNTER — Ambulatory Visit (INDEPENDENT_AMBULATORY_CARE_PROVIDER_SITE_OTHER): Payer: Medicare Other

## 2021-06-23 DIAGNOSIS — I6523 Occlusion and stenosis of bilateral carotid arteries: Secondary | ICD-10-CM | POA: Diagnosis not present

## 2021-06-28 ENCOUNTER — Encounter: Payer: Self-pay | Admitting: *Deleted

## 2021-06-28 NOTE — Progress Notes (Signed)
This encounter was created in error - please disregard.

## 2022-01-30 ENCOUNTER — Other Ambulatory Visit (INDEPENDENT_AMBULATORY_CARE_PROVIDER_SITE_OTHER): Payer: Self-pay | Admitting: Internal Medicine

## 2022-02-18 DIAGNOSIS — M545 Low back pain, unspecified: Secondary | ICD-10-CM | POA: Diagnosis not present

## 2022-02-18 DIAGNOSIS — N3001 Acute cystitis with hematuria: Secondary | ICD-10-CM | POA: Diagnosis not present

## 2022-03-02 ENCOUNTER — Encounter (INDEPENDENT_AMBULATORY_CARE_PROVIDER_SITE_OTHER): Payer: Self-pay | Admitting: Gastroenterology

## 2022-03-02 ENCOUNTER — Ambulatory Visit (INDEPENDENT_AMBULATORY_CARE_PROVIDER_SITE_OTHER): Payer: Medicare Other | Admitting: Gastroenterology

## 2022-03-06 ENCOUNTER — Ambulatory Visit
Admission: EM | Admit: 2022-03-06 | Discharge: 2022-03-06 | Discharge status: Against Medical Advice | Payer: Medicare Other

## 2022-03-07 DIAGNOSIS — N1831 Chronic kidney disease, stage 3a: Secondary | ICD-10-CM | POA: Diagnosis not present

## 2022-03-07 DIAGNOSIS — R300 Dysuria: Secondary | ICD-10-CM | POA: Diagnosis not present

## 2022-05-02 ENCOUNTER — Other Ambulatory Visit (INDEPENDENT_AMBULATORY_CARE_PROVIDER_SITE_OTHER): Payer: Self-pay | Admitting: Gastroenterology

## 2022-05-29 DIAGNOSIS — R030 Elevated blood-pressure reading, without diagnosis of hypertension: Secondary | ICD-10-CM | POA: Diagnosis not present

## 2022-05-29 DIAGNOSIS — R5383 Other fatigue: Secondary | ICD-10-CM | POA: Diagnosis not present

## 2022-05-29 DIAGNOSIS — N3090 Cystitis, unspecified without hematuria: Secondary | ICD-10-CM | POA: Diagnosis not present

## 2022-06-05 ENCOUNTER — Encounter: Payer: Self-pay | Admitting: *Deleted

## 2022-06-05 ENCOUNTER — Encounter: Payer: Self-pay | Admitting: Nurse Practitioner

## 2022-06-05 ENCOUNTER — Ambulatory Visit: Payer: Medicare Other | Attending: Nurse Practitioner | Admitting: Nurse Practitioner

## 2022-06-05 VITALS — BP 160/64 | HR 54 | Ht 62.0 in | Wt 139.2 lb

## 2022-06-05 DIAGNOSIS — I6523 Occlusion and stenosis of bilateral carotid arteries: Secondary | ICD-10-CM | POA: Diagnosis not present

## 2022-06-05 DIAGNOSIS — K219 Gastro-esophageal reflux disease without esophagitis: Secondary | ICD-10-CM | POA: Diagnosis not present

## 2022-06-05 DIAGNOSIS — R6889 Other general symptoms and signs: Secondary | ICD-10-CM

## 2022-06-05 DIAGNOSIS — I10 Essential (primary) hypertension: Secondary | ICD-10-CM | POA: Diagnosis not present

## 2022-06-05 DIAGNOSIS — E559 Vitamin D deficiency, unspecified: Secondary | ICD-10-CM | POA: Diagnosis not present

## 2022-06-05 DIAGNOSIS — E875 Hyperkalemia: Secondary | ICD-10-CM | POA: Diagnosis not present

## 2022-06-05 DIAGNOSIS — E782 Mixed hyperlipidemia: Secondary | ICD-10-CM | POA: Diagnosis not present

## 2022-06-05 DIAGNOSIS — E538 Deficiency of other specified B group vitamins: Secondary | ICD-10-CM | POA: Diagnosis not present

## 2022-06-05 DIAGNOSIS — E039 Hypothyroidism, unspecified: Secondary | ICD-10-CM | POA: Diagnosis not present

## 2022-06-05 MED ORDER — AMLODIPINE BESYLATE 10 MG PO TABS: 10.0000 mg | ORAL_TABLET | Freq: Every day | ORAL | 1 refills | Status: AC

## 2022-06-05 NOTE — Patient Instructions (Addendum)
Medication Instructions:  Your physician has recommended you make the following change in your medication:  Increase amlodipine to 10 mg daily Continue other medications the same  Labwork: none  Testing/Procedures: none  Follow-Up: Your physician recommends that you schedule a follow-up appointment in: 3 months  Any Other Special Instructions Will Be Listed Below (If Applicable). Please try and purchase an [employer] Blood Pressure cuff Your physician has requested that you regularly monitor and record your blood pressure readings at home. Please use the same machine at the same time of day to check your readings and record them to bring to your follow-up visit.   If you need a refill on your cardiac medications before your next appointment, please call your pharmacy.

## 2022-06-05 NOTE — Progress Notes (Unsigned)
Office Visit    Patient Name: Katie Chan Date of Encounter: 06/05/2022  PCP:  Lawerance Sabal, PA   [facility]  Cardiologist:  Nona Dell, MD *** Advanced Practice Provider:  No care team member to display Electrophysiologist:  None  {Press F2 to show EP APP, CHF, sleep or structural heart MD               :[id number]}  { Click here to update then REFRESH NOTE - MD (PCP) or APP (Team Member)  Change PCP Type for MD, Specialty for APP is either Cardiology or Clinical Cardiac Electrophysiology  :[id number]}  Chief Complaint    Katie Chan is a 72 y.o. female with a hx of carotid artery disease, hypertension, GERD, and cardiac murmur, who presents today for 1 year follow-up.  Past Medical History    Past Medical History:  Diagnosis Date   Carotid artery disease (HCC)    Essential hypertension    Fracture of radius, distal, right, closed 05/04/2015   GERD (gastroesophageal reflux disease)    Past Surgical History:  Procedure Laterality Date   BALLOON DILATION  11/16/2020   Procedure: BALLOON DILATION;  Surgeon: Dolores Frame, MD;  [facility];  Service: Gastroenterology;;   ESOPHAGOGASTRODUODENOSCOPY (EGD) WITH PROPOFOL N/A 11/16/2020   Procedure: ESOPHAGOGASTRODUODENOSCOPY (EGD) WITH PROPOFOL;  Surgeon: Dolores Frame, MD;  [facility];  Service: Gastroenterology;  Laterality: N/A;  7:30   OPEN REDUCTION INTERNAL FIXATION (ORIF) DISTAL RADIAL FRACTURE Right 05/04/2015   Procedure: OPEN REDUCTION INTERNAL FIXATION (ORIF) RIGHT DISTAL RADIUS FRACTURE;  Surgeon: Teryl Lucy, MD;  Location: [facility];  Service: Orthopedics;  Laterality: Right;   TONSILLECTOMY AND ADENOIDECTOMY      Allergies  No Known Allergies  History of Present Illness    Katie Chan is a 72 y.o. female with a PMH as mentioned above.  Last seen by Dr. Diona Browner on May 26, 2021.  Was overall doing  well at the time.  Was started on aspirin 81 mg daily for history of bilateral carotid artery disease.  Follow-up carotid Dopplers were arranged - see report below.   Today she presents for 1 year follow-up.  She states   EKGs/Labs/Other Studies Reviewed:   The following studies were reviewed today: ***  EKG:  EKG is ordered today.  The ekg ordered today demonstrates sinus bradycardia, 54 bpm, left anterior fascicular block, LVH with nonspecific ST/T wave abnormality, no acute ischemic changes.   Recent Labs: No results found for requested labs within last 365 days.  Recent Lipid Panel No results found for: "CHOL", "TRIG", "HDL", "CHOLHDL", "VLDL", "LDLCALC", "LDLDIRECT"  Risk Assessment/Calculations:  {Does this patient have ATRIAL FIBRILLATION?:[phone]}  Home Medications   No outpatient medications have been marked as taking for the 06/05/22 encounter (Appointment) with Sharlene Dory, NP.     Review of Systems   ***   All other systems reviewed and are otherwise negative except as noted above.  Physical Exam    VS:  There were no vitals taken for this visit. , BMI There is no height or weight on file to calculate BMI.  Wt Readings from Last 3 Encounters:  05/26/21 144 lb (65.3 kg)  11/08/20 142 lb 14.4 oz (64.8 kg)  10/16/19 144 lb (65.3 kg)     GEN: Well nourished, well developed, in no acute distress. HEENT: normal. Neck: Supple, no JVD, carotid bruits, or masses. Cardiac: ***RRR, no murmurs, rubs,  or gallops. No clubbing, cyanosis, edema.  ***Radials/PT 2+ and equal bilaterally.  Respiratory:  ***Respirations regular and unlabored, clear to auscultation bilaterally. GI: Soft, nontender, nondistended. MS: No deformity or atrophy. Skin: Warm and dry, no rash. Neuro:  Strength and sensation are intact. Psych: Normal affect.  Assessment & Plan    ***  {Are you ordering a CV Procedure (e.g. stress test, cath, DCCV, TEE, etc)?   Press F2        :[id number]}       Disposition: Follow up {follow up:15908} with Nona Dell, MD or APP.  Signed, Sharlene Dory, NP 06/05/2022, 8:33 AM [facility]

## 2022-06-12 DIAGNOSIS — R5383 Other fatigue: Secondary | ICD-10-CM | POA: Diagnosis not present

## 2022-06-12 DIAGNOSIS — R350 Frequency of micturition: Secondary | ICD-10-CM | POA: Diagnosis not present

## 2022-06-12 DIAGNOSIS — N189 Chronic kidney disease, unspecified: Secondary | ICD-10-CM | POA: Diagnosis not present

## 2022-06-12 DIAGNOSIS — R030 Elevated blood-pressure reading, without diagnosis of hypertension: Secondary | ICD-10-CM | POA: Diagnosis not present

## 2022-07-10 DIAGNOSIS — E875 Hyperkalemia: Secondary | ICD-10-CM | POA: Diagnosis not present

## 2022-07-10 DIAGNOSIS — E538 Deficiency of other specified B group vitamins: Secondary | ICD-10-CM | POA: Diagnosis not present

## 2022-07-10 DIAGNOSIS — E559 Vitamin D deficiency, unspecified: Secondary | ICD-10-CM | POA: Diagnosis not present

## 2022-07-10 DIAGNOSIS — N189 Chronic kidney disease, unspecified: Secondary | ICD-10-CM | POA: Diagnosis not present

## 2022-07-17 DIAGNOSIS — R001 Bradycardia, unspecified: Secondary | ICD-10-CM | POA: Diagnosis not present

## 2022-07-17 DIAGNOSIS — N189 Chronic kidney disease, unspecified: Secondary | ICD-10-CM | POA: Diagnosis not present

## 2022-07-17 DIAGNOSIS — R5383 Other fatigue: Secondary | ICD-10-CM | POA: Diagnosis not present

## 2022-07-17 DIAGNOSIS — R030 Elevated blood-pressure reading, without diagnosis of hypertension: Secondary | ICD-10-CM | POA: Diagnosis not present

## 2022-08-14 DIAGNOSIS — R3129 Other microscopic hematuria: Secondary | ICD-10-CM | POA: Diagnosis not present

## 2022-08-14 DIAGNOSIS — R300 Dysuria: Secondary | ICD-10-CM | POA: Diagnosis not present

## 2022-08-14 DIAGNOSIS — R030 Elevated blood-pressure reading, without diagnosis of hypertension: Secondary | ICD-10-CM | POA: Diagnosis not present

## 2022-08-30 ENCOUNTER — Other Ambulatory Visit: Payer: Self-pay

## 2022-08-30 ENCOUNTER — Emergency Department (HOSPITAL_COMMUNITY)
Admission: EM | Admit: 2022-08-30 | Discharge: 2022-08-30 | Disposition: A | Discharge status: Home / Self Care | Payer: Medicare Other | Attending: Student | Admitting: Student

## 2022-08-30 ENCOUNTER — Emergency Department (HOSPITAL_COMMUNITY): Payer: Medicare Other

## 2022-08-30 ENCOUNTER — Encounter (HOSPITAL_COMMUNITY): Payer: Self-pay | Admitting: *Deleted

## 2022-08-30 DIAGNOSIS — Z79899 Other long term (current) drug therapy: Secondary | ICD-10-CM | POA: Insufficient documentation

## 2022-08-30 DIAGNOSIS — Z7982 Long term (current) use of aspirin: Secondary | ICD-10-CM | POA: Diagnosis not present

## 2022-08-30 DIAGNOSIS — R21 Rash and other nonspecific skin eruption: Secondary | ICD-10-CM | POA: Diagnosis not present

## 2022-08-30 DIAGNOSIS — I10 Essential (primary) hypertension: Secondary | ICD-10-CM | POA: Insufficient documentation

## 2022-08-30 DIAGNOSIS — L03113 Cellulitis of right upper limb: Secondary | ICD-10-CM | POA: Insufficient documentation

## 2022-08-30 DIAGNOSIS — M19031 Primary osteoarthritis, right wrist: Secondary | ICD-10-CM | POA: Diagnosis not present

## 2022-08-30 DIAGNOSIS — I2510 Atherosclerotic heart disease of native coronary artery without angina pectoris: Secondary | ICD-10-CM | POA: Diagnosis not present

## 2022-08-30 DIAGNOSIS — S52611A Displaced fracture of right ulna styloid process, initial encounter for closed fracture: Secondary | ICD-10-CM | POA: Diagnosis not present

## 2022-08-30 MED ORDER — PREDNISONE 20 MG PO TABS: 40.0000 mg | ORAL_TABLET | Freq: Every day | ORAL | 0 refills | Status: AC

## 2022-08-30 MED ORDER — PREDNISONE 20 MG PO TABS
40.0000 mg | ORAL_TABLET | Freq: Once | ORAL | Status: AC
  Administered 2022-08-30: 40 mg via ORAL
  Filled 2022-08-30: qty 2

## 2022-08-30 MED ORDER — DIPHENHYDRAMINE HCL 25 MG PO CAPS
25.0000 mg | ORAL_CAPSULE | Freq: Once | ORAL | Status: AC
  Administered 2022-08-30: 25 mg via ORAL
  Filled 2022-08-30: qty 1

## 2022-08-30 MED ORDER — DOXYCYCLINE HYCLATE 100 MG PO CAPS: 100.0000 mg | ORAL_CAPSULE | Freq: Two times a day (BID) | ORAL | 0 refills | Status: AC

## 2022-08-30 MED ORDER — CETIRIZINE HCL 10 MG PO TABS: 10.0000 mg | ORAL_TABLET | Freq: Every day | ORAL | 0 refills | Status: DC

## 2022-08-30 MED ORDER — DOXYCYCLINE HYCLATE 100 MG PO TABS
100.0000 mg | ORAL_TABLET | Freq: Once | ORAL | Status: AC
  Administered 2022-08-30: 100 mg via ORAL
  Filled 2022-08-30: qty 1

## 2022-08-30 NOTE — ED Notes (Signed)
ED Provider at bedside. 

## 2022-08-30 NOTE — ED Provider Notes (Signed)
[facility] EMERGENCY DEPARTMENT AT [facility] Provider Note   CSN: [id number] Arrival date & time: 08/30/22  1052     History  Chief Complaint  Patient presents with   Rash    Katie Chan is a 72 y.o. female.  She has PMH of hypertension, GERD, carotid artery disease.  Presents to ER complaining of rash and itching and swelling to the right arm that started today.  Denies fever or chills.  States she started with a few bumps on her legs last night posteriorly behind her knees that were very itchy.  Today she woke up and started having itching and rash to bilateral arms.  Swelling to the right hand noted.  No fevers or chills, no nausea or vomiting.  Went to urgent care was sent to the ER for further evaluation.  She did have a UTI and was treated 2 weeks ago, finished antibiotics a week ago.  Is not sure of the medication she was on.   Rash      Home Medications Prior to Admission medications   Medication Sig Start Date End Date Taking? Authorizing Provider  cetirizine (ZYRTEC ALLERGY) 10 MG tablet Take 1 tablet (10 mg total) by mouth daily. 08/30/22  Yes ,  A, PA-C  doxycycline (VIBRAMYCIN) 100 MG capsule Take 1 capsule (100 mg total) by mouth 2 (two) times daily for 7 days. 08/30/22 09/06/22 Yes ,  A, PA-C  predniSONE (DELTASONE) 20 MG tablet Take 2 tablets (40 mg total) by mouth daily for 4 days. 08/30/22 09/03/22 Yes ,  A, PA-C  amLODipine (NORVASC) 10 MG tablet Take 1 tablet (10 mg total) by mouth daily. 06/05/22   Sharlene Dory, NP  aspirin EC 81 MG tablet Take 1 tablet (81 mg total) by mouth daily. Swallow whole. Patient not taking: Reported on 06/05/2022 05/26/21   Jonelle Sidle, MD  dicyclomine (BENTYL) 10 MG capsule Take 10 mg by mouth in the morning and at bedtime.    [provider]  hydrALAZINE (APRESOLINE) 25 MG tablet Take 25 mg by mouth 2 (two) times daily.    [provider]  hydrochlorothiazide  (HYDRODIURIL) 25 MG tablet Take 25 mg by mouth every morning. 07/30/19   [provider]  lisinopril (ZESTRIL) 40 MG tablet Take 40 mg by mouth daily.    [provider]  omeprazole (PRILOSEC) 20 MG capsule TAKE ONE CAPSULE BY MOUTH ONCE DAILY 01/30/22   Dolores Frame, MD  OVER THE COUNTER MEDICATION Vit b 12 one daily    [provider]      Allergies    Patient has no known allergies.    Review of Systems   Review of Systems  Skin:  Positive for rash.    Physical Exam Updated Vital Signs BP (!) 127/91 (BP Location: Left Arm)   Pulse (!) 54   Temp 98.8 F (37.1 C) (Oral)   Resp 18   Ht 5\' 2"  (1.575 m)   Wt 64.4 kg   SpO2 98%   BMI 25.97 kg/m  Physical Exam Vitals and nursing note reviewed.  Constitutional:      General: She is not in acute distress.    Appearance: She is well-developed.  HENT:     Head: Normocephalic and atraumatic.     Mouth/Throat:     Mouth: Mucous membranes are moist.  Eyes:     Extraocular Movements: Extraocular movements intact.     Conjunctiva/sclera: Conjunctivae normal.  Pupils: Pupils are equal, round, and reactive to light.  Cardiovascular:     Rate and Rhythm: Normal rate and regular rhythm.     Heart sounds: No murmur heard. Pulmonary:     Effort: Pulmonary effort is normal. No respiratory distress.     Breath sounds: Normal breath sounds.  Abdominal:     Palpations: Abdomen is soft.     Tenderness: There is no abdominal tenderness.  Musculoskeletal:        General: No swelling.     Cervical back: Neck supple.  Skin:    General: Skin is warm and dry.     Capillary Refill: Capillary refill takes less than 2 seconds.     Comments: few urticarial lesions to bilateral popliteal area.  Possible confluent urticaria to left forearm, right forearm has multiple confluent urticaria with erythema extending to the area just distal to the antecubital fossa with significant soft tissue swelling of the right  hand.  Normal pulses, well perfused, no Nikolsky sign, no oral lesions, no petechia  Neurological:     General: No focal deficit present.     Mental Status: She is alert and oriented to person, place, and time.  Psychiatric:        Mood and Affect: Mood normal.        ED Results / Procedures / Treatments   Labs (all labs ordered are listed, but only abnormal results are displayed) Labs Reviewed - No data to display  EKG None  Radiology DG Forearm Right  Result Date: 08/30/2022 CLINICAL DATA:  swelling, redness EXAM: RIGHT FOREARM - 2 VIEW COMPARISON:  None Available. FINDINGS: No acute fracture or dislocation. No aggressive osseous lesion. 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 Patient is status post ORIF of distal right metaphysis with plate and multiple cortical screws. Mild arthritis of imaged joints. No radiopaque foreign bodies. Soft tissues are within normal limits. IMPRESSION: 1. No acute fracture or dislocation. 2. 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 Electronically Signed   By: Jules Schick M.D.   On: 08/30/2022 14:51    Procedures Procedures    Medications Ordered in ED Medications  predniSONE (DELTASONE) tablet 40 mg (40 mg Oral Given 08/30/22 1326)  diphenhydrAMINE (BENADRYL) capsule 25 mg (25 mg Oral Given 08/30/22 1326)  doxycycline (VIBRA-TABS) tablet 100 mg (100 mg Oral Given 08/30/22 1539)    ED Course/ Medical Decision Making/ A&P                                 Medical Decision Making Dx: Cellulitis, urticaria, allergic reaction, contact dermatitis, viral exanthem, Stevens-Johnson syndrome, other ED course: Patient with a rash that is itchy to primarily her arms but few lesions on bilateral posterior knees.  Started last night, worse this morning, specifically her right hand is swollen and erythematous with some erythema to the right forearm.  No new medications, no new foods, no other new contacts including soaps lotions or  cosmetics.  She has not been outside, denies any bug bites.  She has no systemic symptoms.  No conjunctivitis, no oral lesions or complaint of genital lesions.  Do not suspect SJS/TN, patient be likely allergic reaction to something with superimposed ice to the right arm possibly.  Will treat with doxycycline given degree of swelling.  X-ray ordered, no soft tissue gas, no crepitus or vital sign abnormalities including no fever to suggest this is a necrotizing infection.  Patient also evaluated by attending.  She was given strict follow-up and return precautions.  Amount and/or Complexity of Data Reviewed Radiology: ordered.  Risk OTC drugs. Prescription drug management.           Final Clinical Impression(s) / ED Diagnoses Final diagnoses:  Rash  Cellulitis of right arm    Rx / DC Orders ED Discharge Orders          Ordered    doxycycline (VIBRAMYCIN) 100 MG capsule  2 times daily        08/30/22 1537    predniSONE (DELTASONE) 20 MG tablet  Daily        08/30/22 1537    cetirizine (ZYRTEC ALLERGY) 10 MG tablet  Daily        08/30/22 [address] PA-C 08/30/22 1707    Glendora Score, MD 08/30/22 2149

## 2022-08-30 NOTE — Discharge Instructions (Signed)
Pleasure taking care of you today.  You were seen for rash on your legs and arms with swelling to the right hand and arm.  You are being given antihistamines and steroids to help with the itching and hives, appears that you are having some type of allergic reaction, though are not able to determine what to do.  We are concerned that there is cellulitis (a skin infection) your right hand and arm as well so we are also giving you antibiotics.  Is important to have a recheck with your primary care doctor in about 2 days.  If you start having increasing redness, fever, increased pain or swelling or any new or worsening symptoms he should come back to the ER.

## 2022-08-30 NOTE — ED Triage Notes (Signed)
Pt presents with rash to arms and legs with right hand swelling, unknown origin.

## 2022-09-01 ENCOUNTER — Encounter: Payer: Self-pay | Admitting: Pharmacist

## 2022-09-01 DIAGNOSIS — Z9189 Other specified personal risk factors, not elsewhere classified: Secondary | ICD-10-CM

## 2022-09-01 NOTE — Progress Notes (Signed)
[facility])     [facility] Quality Pharmacy Team Statin Quality Measure Assessment  09/01/2022  [name] January 20, 1951 [id number]  Per review of chart and payor information, [name] has a diagnosis of cardiovascular disease but is not currently filling a statin prescription. This places patient into the [facility] (Statin Use In Patients with Cardiovascular Disease) measure for CMS.    I could not find any documentation of previous trial of a statin or a history of statin intolerance. Please consider evaluating [name] for statin therapy during her office visit on Monday.   Please consider ONE of the following recommendations:  Initiate high intensity statin Atorvastatin 40mg  once daily, #90, 3 refills   Rosuvastatin 20mg  once daily, #90, 3 refills    Initiate moderate intensity  statin with reduced frequency if prior  statin intolerance 1x weekly, #13, 3 refills   2x weekly, #26, 3 refills   3x weekly, #39, 3 refills    Code for past statin intolerance  (required annually)  Provider Requirements: Must associate code during an office visit or telehealth encounter   Drug Induced Myopathy G72.0   Myalgia (SPC ONLY) M79.1   Myositis, unspecified M60.9   Myopathy, unspecified G72.9   Rhabdomyolysis M62.82   Thank you for allowing [facility] to be a part of this patient's care.   Dellie Burns, PharmD Clinical Pharmacist [place]  Direct Dial: [phone]

## 2022-09-04 ENCOUNTER — Ambulatory Visit: Payer: Medicare Other | Attending: Nurse Practitioner | Admitting: Nurse Practitioner

## 2022-09-04 ENCOUNTER — Encounter: Payer: Self-pay | Admitting: *Deleted

## 2022-09-04 ENCOUNTER — Encounter: Payer: Self-pay | Admitting: Nurse Practitioner

## 2022-09-04 VITALS — BP 137/82 | HR 53 | Ht 62.0 in | Wt 140.0 lb

## 2022-09-04 DIAGNOSIS — I10 Essential (primary) hypertension: Secondary | ICD-10-CM | POA: Diagnosis not present

## 2022-09-04 DIAGNOSIS — I6523 Occlusion and stenosis of bilateral carotid arteries: Secondary | ICD-10-CM

## 2022-09-04 MED ORDER — OMRON 3 SERIES BP MONITOR DEVI: 1 refills | Status: DC

## 2022-09-04 NOTE — Progress Notes (Signed)
Office Visit    Patient Name: Katie Chan Date of Encounter: 09/04/2022 PCP:  Lawerance Sabal, PA [facility]  Cardiologist:  Nona Dell, MD  Advanced Practice Provider:  No care team member to display Electrophysiologist:  None   Chief Complaint and HPI    Katie Chan is a 72 y.o. female with a hx of carotid artery disease, hypertension, GERD, and cardiac murmur, who presents today for scheduled follow-up.  Last seen by Dr. Diona Chan on May 26, 2021.  Was overall doing well at the time.  Was started on aspirin 81 mg daily for history of bilateral carotid artery disease.  Follow-up carotid Dopplers were arranged - see report below.   I last saw her for 1 year follow-up on Jun 05, 2022. Overall doing well from a cardiac perspective. Did admit to some confusion, admitted to forgetfulness, started noticing this a couple of months ago.  Recently seen in ED for rash/arm swelling. Started on Doxycycline, prednisone, and Zyrtec.   Today she presents for follow-up. Doing well from a cardiac perspective. Denies any chest pain, shortness of breath, palpitations, syncope, presyncope, dizziness, orthopnea, PND, swelling or significant weight changes, acute bleeding, or claudication.   EKGs/Labs/Other Studies Reviewed:   The following studies were reviewed today:  EKG:  EKG is not ordered today.   Carotid duplex 06/2021:  Summary:  Right Carotid: Velocities in the right ICA are consistent with a 1-39%  stenosis.   Left Carotid: Velocities in the left ICA are consistent with a 1-39%  stenosis.   Vertebrals: Bilateral vertebral arteries demonstrate antegrade flow.  Subclavians: Normal flow hemodynamics were seen in bilateral subclavian arteries.  Cardiac monitor 09/2019:  14 day monitor Max HR 182, Min HR 24, Avg HR 61 Rare supraventricular ectopy in the form of isolated PACs, couplets, triplets. Short runs of SVT up to 7 beats Rare ventricular ectopy  in the form of isolated PVCs, couplets No diary of symptoms reported Episode of wandering atrial pacemaker with 2.4 second sinus pause.  Myoview 07/2019:  There was no ST segment deviation noted during stress. This is a low risk study. The left ventricular ejection fraction is hyperdynamic (>65%). Small mild intensity apical defect with mild reversibility likely secondary to apical thinning, cannot completely exclude mild apical ischemia. Either finding would support low risk.  Echo 07/2019:  1. Left ventricular ejection fraction, by estimation, is 65 to 70%. The  left ventricle has normal function. The left ventricle has no regional  wall motion abnormalities. There is mild left ventricular hypertrophy.  Left ventricular diastolic parameters  were normal.   2. Right ventricular systolic function is normal. The right ventricular  size is normal. There is normal pulmonary artery systolic pressure.   3. The mitral valve is normal in structure. Trivial mitral valve  regurgitation. No evidence of mitral stenosis.   4. The aortic valve is tricuspid. Aortic valve regurgitation is not  visualized. No aortic stenosis is present.   5. The inferior vena cava is normal in size with greater than 50%  respiratory variability, suggesting right atrial pressure of 3 mmHg.   Review of Systems    All other systems reviewed and are otherwise negative except as noted above.  Physical Exam    VS:  BP 137/82 (BP Location: Left Arm, Patient Position: Sitting, Cuff Size: Normal)   Pulse (!) 53   Ht 5\' 2"  (1.575 m)   Wt 140 lb (63.5 kg)   SpO2 96%  BMI 25.61 kg/m  , BMI Body mass index is 25.61 kg/m.  Wt Readings from Last 3 Encounters:  09/04/22 140 lb (63.5 kg)  08/30/22 142 lb (64.4 kg)  06/05/22 139 lb 3.2 oz (63.1 kg)    GEN: Well nourished, well developed, in no acute distress. HEENT: normal. Neck: Supple, no JVD, carotid bruits, or masses. Cardiac: S1/S2, slow rate and regular rhythm,  no murmurs, rubs, or gallops. No clubbing, cyanosis, edema.  Radials/PT 2+ and equal bilaterally.  Respiratory:  Respirations regular and unlabored, clear to auscultation bilaterally. MS: No deformity or atrophy. Skin: Warm and dry, no rash. Neuro:  Strength and sensation are intact. Psych: Normal affect.  Assessment & Plan    HTN BP on arrival 148/70, repeat BP 137/82. SBP goal < 140. Given BP log and discussed to monitor BP at home at least 2 hours after medications and sitting for 5-10 minutes. Will write Rx for BP cuff. Continue amlodipine, hydralazine, HCTZ, and lisinopril. Heart healthy diet and regular cardiovascular exercise encouraged.   Bilateral carotid artery stenosis Carotid doppler 06/2021 showed mild bilateral ICA stenosis. Continue Aspirin. Will request labs from PCP as this will be used to calculate ASCVD risk score to determine if patient needs to be started on statin. Heart healthy diet and regular cardiovascular exercise encouraged.   Disposition: Follow up in 3 month(s) with Nona Dell, MD or APP.  Signed, Sharlene Dory, NP 09/04/2022, 12:55 PM [facility]

## 2022-09-04 NOTE — Patient Instructions (Addendum)
Medication Instructions:   Continue all current medications.   Labwork:  none  Testing/Procedures:  none  Follow-Up:  3 months   Any Other Special Instructions Will Be Listed Below (If Applicable). Prescription for BP cuff sent to pharmacy today Log BP readings & if stay consistently above 140, please notify the office   If you need a refill on your cardiac medications before your next appointment, please call your pharmacy.

## 2022-09-05 ENCOUNTER — Encounter: Payer: Self-pay | Admitting: Family Medicine

## 2022-09-06 ENCOUNTER — Other Ambulatory Visit: Payer: Self-pay | Admitting: *Deleted

## 2022-09-06 ENCOUNTER — Telehealth: Payer: Self-pay | Admitting: *Deleted

## 2022-09-06 DIAGNOSIS — Z79899 Other long term (current) drug therapy: Secondary | ICD-10-CM

## 2022-09-06 DIAGNOSIS — I6523 Occlusion and stenosis of bilateral carotid arteries: Secondary | ICD-10-CM

## 2022-09-06 NOTE — Telephone Encounter (Signed)
Patient's husband Leonette Most informed and verbalized understanding of plan. Lab orders faxed to PCP office per patient request

## 2022-09-06 NOTE — Telephone Encounter (Signed)
-----   Message from Sharlene Dory sent at 09/06/2022  1:41 PM EDT ----- Labs reviewed. I do not see a recent FLP and LFT from PCP's office. Let's please arrange this to be done in the next 1-2 weeks as I need to see if she requires a statin. Diagnosis can be bilateral carotid artery stenosis.   Thanks!  Sharlene Dory, AGNP-C

## 2022-09-25 DIAGNOSIS — M5416 Radiculopathy, lumbar region: Secondary | ICD-10-CM | POA: Diagnosis not present

## 2022-09-25 DIAGNOSIS — M519 Unspecified thoracic, thoracolumbar and lumbosacral intervertebral disc disorder: Secondary | ICD-10-CM | POA: Diagnosis not present

## 2022-09-25 DIAGNOSIS — M62830 Muscle spasm of back: Secondary | ICD-10-CM | POA: Diagnosis not present

## 2022-09-25 DIAGNOSIS — R030 Elevated blood-pressure reading, without diagnosis of hypertension: Secondary | ICD-10-CM | POA: Diagnosis not present

## 2022-09-25 DIAGNOSIS — N10 Acute tubulo-interstitial nephritis: Secondary | ICD-10-CM | POA: Diagnosis not present

## 2022-09-25 DIAGNOSIS — N200 Calculus of kidney: Secondary | ICD-10-CM | POA: Diagnosis not present

## 2022-09-25 DIAGNOSIS — N201 Calculus of ureter: Secondary | ICD-10-CM | POA: Diagnosis not present

## 2022-09-26 DIAGNOSIS — M545 Low back pain, unspecified: Secondary | ICD-10-CM | POA: Diagnosis not present

## 2022-09-26 DIAGNOSIS — I10 Essential (primary) hypertension: Secondary | ICD-10-CM | POA: Diagnosis not present

## 2022-09-26 DIAGNOSIS — R350 Frequency of micturition: Secondary | ICD-10-CM | POA: Diagnosis not present

## 2022-09-26 DIAGNOSIS — R079 Chest pain, unspecified: Secondary | ICD-10-CM | POA: Diagnosis not present

## 2022-09-30 DIAGNOSIS — R030 Elevated blood-pressure reading, without diagnosis of hypertension: Secondary | ICD-10-CM | POA: Diagnosis not present

## 2022-09-30 DIAGNOSIS — M5441 Lumbago with sciatica, right side: Secondary | ICD-10-CM | POA: Diagnosis not present

## 2022-09-30 DIAGNOSIS — R197 Diarrhea, unspecified: Secondary | ICD-10-CM | POA: Diagnosis not present

## 2022-09-30 DIAGNOSIS — M2550 Pain in unspecified joint: Secondary | ICD-10-CM | POA: Diagnosis not present

## 2022-10-03 DIAGNOSIS — M4316 Spondylolisthesis, lumbar region: Secondary | ICD-10-CM | POA: Diagnosis not present

## 2022-10-03 DIAGNOSIS — M2550 Pain in unspecified joint: Secondary | ICD-10-CM | POA: Diagnosis not present

## 2022-10-03 DIAGNOSIS — R030 Elevated blood-pressure reading, without diagnosis of hypertension: Secondary | ICD-10-CM | POA: Diagnosis not present

## 2022-10-03 DIAGNOSIS — M47816 Spondylosis without myelopathy or radiculopathy, lumbar region: Secondary | ICD-10-CM | POA: Diagnosis not present

## 2022-10-03 DIAGNOSIS — M5441 Lumbago with sciatica, right side: Secondary | ICD-10-CM | POA: Diagnosis not present

## 2022-10-03 DIAGNOSIS — R197 Diarrhea, unspecified: Secondary | ICD-10-CM | POA: Diagnosis not present

## 2022-10-03 DIAGNOSIS — M545 Low back pain, unspecified: Secondary | ICD-10-CM | POA: Diagnosis not present

## 2022-10-10 DIAGNOSIS — R5383 Other fatigue: Secondary | ICD-10-CM | POA: Diagnosis not present

## 2022-10-10 DIAGNOSIS — R001 Bradycardia, unspecified: Secondary | ICD-10-CM | POA: Diagnosis not present

## 2022-10-10 DIAGNOSIS — R030 Elevated blood-pressure reading, without diagnosis of hypertension: Secondary | ICD-10-CM | POA: Diagnosis not present

## 2022-10-10 DIAGNOSIS — N189 Chronic kidney disease, unspecified: Secondary | ICD-10-CM | POA: Diagnosis not present

## 2022-10-17 ENCOUNTER — Encounter: Payer: Self-pay | Admitting: *Deleted

## 2022-11-05 DIAGNOSIS — T161XXA Foreign body in right ear, initial encounter: Secondary | ICD-10-CM | POA: Diagnosis not present

## 2022-12-04 DIAGNOSIS — K529 Noninfective gastroenteritis and colitis, unspecified: Secondary | ICD-10-CM | POA: Diagnosis not present

## 2022-12-05 ENCOUNTER — Ambulatory Visit: Payer: Medicare Other | Admitting: Nurse Practitioner

## 2022-12-05 DIAGNOSIS — R109 Unspecified abdominal pain: Secondary | ICD-10-CM | POA: Diagnosis not present

## 2022-12-05 DIAGNOSIS — K529 Noninfective gastroenteritis and colitis, unspecified: Secondary | ICD-10-CM | POA: Diagnosis not present

## 2022-12-23 ENCOUNTER — Emergency Department (HOSPITAL_COMMUNITY)
Admission: EM | Admit: 2022-12-23 | Discharge: 2022-12-23 | Disposition: A | Discharge status: Home / Self Care | Payer: Medicare Other | Attending: Emergency Medicine | Admitting: Emergency Medicine

## 2022-12-23 ENCOUNTER — Encounter (HOSPITAL_COMMUNITY): Payer: Self-pay | Admitting: Radiology

## 2022-12-23 ENCOUNTER — Other Ambulatory Visit: Payer: Self-pay

## 2022-12-23 ENCOUNTER — Emergency Department (HOSPITAL_COMMUNITY): Payer: Medicare Other

## 2022-12-23 DIAGNOSIS — R1013 Epigastric pain: Secondary | ICD-10-CM | POA: Insufficient documentation

## 2022-12-23 DIAGNOSIS — I10 Essential (primary) hypertension: Secondary | ICD-10-CM | POA: Insufficient documentation

## 2022-12-23 DIAGNOSIS — E860 Dehydration: Secondary | ICD-10-CM | POA: Insufficient documentation

## 2022-12-23 DIAGNOSIS — R197 Diarrhea, unspecified: Secondary | ICD-10-CM | POA: Diagnosis not present

## 2022-12-23 DIAGNOSIS — Z7982 Long term (current) use of aspirin: Secondary | ICD-10-CM | POA: Insufficient documentation

## 2022-12-23 DIAGNOSIS — I2510 Atherosclerotic heart disease of native coronary artery without angina pectoris: Secondary | ICD-10-CM | POA: Diagnosis not present

## 2022-12-23 DIAGNOSIS — K8020 Calculus of gallbladder without cholecystitis without obstruction: Secondary | ICD-10-CM | POA: Diagnosis not present

## 2022-12-23 DIAGNOSIS — K5730 Diverticulosis of large intestine without perforation or abscess without bleeding: Secondary | ICD-10-CM | POA: Diagnosis not present

## 2022-12-23 DIAGNOSIS — Z79899 Other long term (current) drug therapy: Secondary | ICD-10-CM | POA: Insufficient documentation

## 2022-12-23 DIAGNOSIS — R112 Nausea with vomiting, unspecified: Secondary | ICD-10-CM | POA: Diagnosis not present

## 2022-12-23 DIAGNOSIS — K8689 Other specified diseases of pancreas: Secondary | ICD-10-CM | POA: Diagnosis not present

## 2022-12-23 LAB — COMPREHENSIVE METABOLIC PANEL
ALT: 11 U/L (ref 0–44)
AST: 14 U/L — ABNORMAL LOW (ref 15–41)
Albumin: 3.5 g/dL (ref 3.5–5.0)
Alkaline Phosphatase: 53 U/L (ref 38–126)
Anion gap: 6 (ref 5–15)
BUN: 38 mg/dL — ABNORMAL HIGH (ref 8–23)
CO2: 20 mmol/L — ABNORMAL LOW (ref 22–32)
Calcium: 9.1 mg/dL (ref 8.9–10.3)
Chloride: 116 mmol/L — ABNORMAL HIGH (ref 98–111)
Creatinine, Ser: 1.52 mg/dL — ABNORMAL HIGH (ref 0.44–1.00)
GFR, Estimated: 36 mL/min — ABNORMAL LOW (ref >60)
Glucose, Bld: 84 mg/dL (ref 70–99)
Potassium: 4.8 mmol/L (ref 3.5–5.1)
Sodium: 142 mmol/L (ref 135–145)
Total Bilirubin: 0.4 mg/dL (ref <1.2)
Total Protein: 6.4 g/dL — ABNORMAL LOW (ref 6.5–8.1)

## 2022-12-23 LAB — URINALYSIS, ROUTINE W REFLEX MICROSCOPIC
Bilirubin Urine: NEGATIVE
Glucose, UA: NEGATIVE mg/dL
Hgb urine dipstick: NEGATIVE
Ketones, ur: NEGATIVE mg/dL
Leukocytes,Ua: NEGATIVE
Nitrite: NEGATIVE
Protein, ur: NEGATIVE mg/dL
Specific Gravity, Urine: 1.014 (ref 1.005–1.030)
pH: 5 (ref 5.0–8.0)

## 2022-12-23 LAB — CBC
HCT: 35.7 % — ABNORMAL LOW (ref 36.0–46.0)
Hemoglobin: 11.2 g/dL — ABNORMAL LOW (ref 12.0–15.0)
MCH: 31.4 pg (ref 26.0–34.0)
MCHC: 31.4 g/dL (ref 30.0–36.0)
MCV: 100 fL (ref 80.0–100.0)
Platelets: 260 10*3/uL (ref 150–400)
RBC: 3.57 MIL/uL — ABNORMAL LOW (ref 3.87–5.11)
RDW: 13.6 % (ref 11.5–15.5)
WBC: 7.7 10*3/uL (ref 4.0–10.5)
nRBC: 0 % (ref 0.0–0.2)

## 2022-12-23 LAB — LIPASE, BLOOD: Lipase: 28 U/L (ref 11–51)

## 2022-12-23 MED ORDER — FAMOTIDINE IN NACL 20-0.9 MG/50ML-% IV SOLN
20.0000 mg | Freq: Once | INTRAVENOUS | Status: AC
  Administered 2022-12-23: 20 mg via INTRAVENOUS
  Filled 2022-12-23: qty 50

## 2022-12-23 MED ORDER — METOCLOPRAMIDE HCL 10 MG PO TABS: 10.0000 mg | ORAL_TABLET | Freq: Four times a day (QID) | ORAL | 0 refills | Status: DC | PRN

## 2022-12-23 MED ORDER — SODIUM CHLORIDE 0.9 % IV BOLUS
500.0000 mL | Freq: Once | INTRAVENOUS | Status: AC
  Administered 2022-12-23: 500 mL via INTRAVENOUS

## 2022-12-23 MED ORDER — IOHEXOL 300 MG/ML  SOLN
80.0000 mL | Freq: Once | INTRAMUSCULAR | Status: AC | PRN
  Administered 2022-12-23: 80 mL via INTRAVENOUS

## 2022-12-23 MED ORDER — ONDANSETRON HCL 4 MG/2ML IJ SOLN
4.0000 mg | Freq: Once | INTRAMUSCULAR | Status: AC
  Administered 2022-12-23: 4 mg via INTRAVENOUS
  Filled 2022-12-23: qty 2

## 2022-12-23 NOTE — Discharge Instructions (Signed)
Continue taking the medicines prescribed by your primary provider last week.  I have added reglan to help with nausea and vomiting.  Call Dr. Levon Hedger Monday for an appointment for further evaluation of your symptoms.

## 2022-12-23 NOTE — ED Triage Notes (Signed)
Pt states she has had stomach pain  and vomiting for the past 5 weeks and has been to the doctor for it but nothing was found. Pt also endorses diarrhea daily. Pt states she was placed omeprazole and bentyl without relief.

## 2022-12-23 NOTE — ED Provider Notes (Signed)
[place] EMERGENCY DEPARTMENT AT [facility] Provider Note   CSN: [id number] Arrival date & time: 12/23/22  1402     History  Chief Complaint  Patient presents with   Abdominal Pain    Katie Chan is a 72 y.o. female with a history including GERD, hypertension, CAD history of chronic diarrhea nausea and vomiting presenting for evaluation of abdominal pain along with intermittent vomiting over the past [redacted] weeks along with daily nonbloody diarrhea.  She was recently seen by her PCP for this problem, was placed on omeprazole and Bentyl and has had no relief for her symptoms.  She does have a history of diverticulitis, but her pain localizes to the epigastric area.  She states she has had a peptid ulcer in the past and has a hiatal hernia. She takes omeprazole daily.  She denies fevers, chills, chest pain, sob.   The history is provided by the patient.       Home Medications Prior to Admission medications   Medication Sig Start Date End Date Taking? Authorizing Provider  metoCLOPramide (REGLAN) 10 MG tablet Take 1 tablet (10 mg total) by mouth every 6 (six) hours as needed for nausea or vomiting. 12/23/22  Yes Francheska Villeda, Raynelle Fanning, PA-C  amLODipine (NORVASC) 10 MG tablet Take 1 tablet (10 mg total) by mouth daily. 06/05/22   Sharlene Dory, NP  aspirin EC 81 MG tablet Take 1 tablet (81 mg total) by mouth daily. Swallow whole. Patient not taking: Reported on 06/05/2022 05/26/21   Jonelle Sidle, MD  Blood Pressure Monitoring (OMRON 3 SERIES BP MONITOR) DEVI Use as directed 09/04/22   Sharlene Dory, NP  cetirizine (ZYRTEC ALLERGY) 10 MG tablet Take 1 tablet (10 mg total) by mouth daily. 08/30/22   Carmel Sacramento A, PA-C  dicyclomine (BENTYL) 10 MG capsule Take 10 mg by mouth in the morning and at bedtime.    [provider]  hydrALAZINE (APRESOLINE) 25 MG tablet Take 25 mg by mouth 2 (two) times daily.    [provider]  hydrochlorothiazide (HYDRODIURIL) 25 MG  tablet Take 25 mg by mouth every morning. 07/30/19   [provider]  lisinopril (ZESTRIL) 40 MG tablet Take 40 mg by mouth daily.    [provider]  omeprazole (PRILOSEC) 20 MG capsule TAKE ONE CAPSULE BY MOUTH ONCE DAILY 01/30/22   Dolores Frame, MD  OVER THE COUNTER MEDICATION Vit b 12 one daily    [provider]      Allergies    Patient has no known allergies.    Review of Systems   Review of Systems  Constitutional:  Negative for chills and fever.  HENT:  Negative for congestion.   Eyes: Negative.   Respiratory:  Negative for chest tightness and shortness of breath.   Cardiovascular:  Negative for chest pain.  Gastrointestinal:  Positive for abdominal pain, diarrhea, nausea and vomiting.  Genitourinary: Negative.   Musculoskeletal:  Negative for arthralgias, joint swelling and neck pain.  Skin: Negative.  Negative for rash and wound.  Neurological:  Negative for dizziness, weakness, light-headedness, numbness and headaches.  Psychiatric/Behavioral: Negative.      Physical Exam Updated Vital Signs BP (!) 153/61   Pulse (!) 56   Temp 98.4 F (36.9 C) (Oral)   Resp 17   Ht 5\' 2"  (1.575 m)   Wt 64.4 kg   SpO2 96%   BMI 25.97 kg/m  Physical Exam Vitals and nursing note reviewed.  Constitutional:  Appearance: She is well-developed.  HENT:     Head: Normocephalic and atraumatic.  Eyes:     Conjunctiva/sclera: Conjunctivae normal.  Cardiovascular:     Rate and Rhythm: Normal rate and regular rhythm.     Heart sounds: Normal heart sounds.  Pulmonary:     Effort: Pulmonary effort is normal.     Breath sounds: Normal breath sounds. No wheezing.  Abdominal:     General: Bowel sounds are normal.     Palpations: Abdomen is soft.     Tenderness: There is abdominal tenderness in the epigastric area. There is no guarding or rebound. Negative signs include Murphy's sign and McBurney's sign.  Musculoskeletal:        General: Normal  range of motion.     Cervical back: Normal range of motion.  Skin:    General: Skin is warm and dry.  Neurological:     Mental Status: She is alert.     ED Results / Procedures / Treatments   Labs (all labs ordered are listed, but only abnormal results are displayed) Labs Reviewed  COMPREHENSIVE METABOLIC PANEL - Abnormal; Notable for the following components:      Result Value   Chloride 116 (*)    CO2 20 (*)    BUN 38 (*)    Creatinine, Ser 1.52 (*)    Total Protein 6.4 (*)    AST 14 (*)    GFR, Estimated 36 (*)    All other components within normal limits  CBC - Abnormal; Notable for the following components:   RBC 3.57 (*)    Hemoglobin 11.2 (*)    HCT 35.7 (*)    All other components within normal limits  URINALYSIS, ROUTINE W REFLEX MICROSCOPIC - Abnormal; Notable for the following components:   Color, Urine STRAW (*)    All other components within normal limits  LIPASE, BLOOD    EKG EKG Interpretation Date/Time:  Saturday December 23 2022 17:33:30 EST Ventricular Rate:  56 PR Interval:  150 QRS Duration:  90 QT Interval:  392 QTC Calculation: 379 R Axis:   -48  Text Interpretation: Sinus rhythm Left anterior fascicular block Left ventricular hypertrophy Anterior Q waves, possibly due to LVH Confirmed by Vivi Barrack [phone]) on 12/23/2022 5:52:26 PM  Radiology CT ABDOMEN PELVIS W CONTRAST  Result Date: 12/23/2022 CLINICAL DATA:  Epigastric pain EXAM: CT ABDOMEN AND PELVIS WITH CONTRAST TECHNIQUE: Multidetector CT imaging of the abdomen and pelvis was performed using the standard protocol following bolus administration of intravenous contrast. RADIATION DOSE REDUCTION: This exam was performed according to the departmental dose-optimization program which includes automated exposure control, adjustment of the mA and/or kV according to patient size and/or use of iterative reconstruction technique. CONTRAST:  80mL OMNIPAQUE IOHEXOL 300 MG/ML  SOLN COMPARISON:  None  Available. FINDINGS: Lower chest: No acute abnormality. Hepatobiliary: No focal liver abnormality is seen. Numerous stones within the gallbladder. No pericholecystic inflammatory changes by CT. Mild intra- and extrahepatic biliary dilatation. Pancreas: Pancreatic atrophy. Main pancreatic duct is dilated at 6 mm. No focal parenchymal abnormality. No inflammatory changes. Spleen: Normal in size without focal abnormality. Adrenals/Urinary Tract: Adrenal glands are unremarkable. Kidneys are normal, without renal calculi, focal lesion, or hydronephrosis. Bladder is unremarkable. Stomach/Bowel: Stomach within normal limits. There are a few fluid-filled loops of small bowel. No abnormally dilated loops of bowel to suggest obstruction. Normal appendix. Extensive left-sided colonic diverticulosis. No focal bowel wall thickening or inflammatory changes. Vascular/Lymphatic: Aortic atherosclerosis. No enlarged abdominal or  pelvic lymph nodes. Reproductive: Unremarkable uterus. 2.0 cm simple-appearing left adnexal cyst. No follow-up imaging is recommended. No right adnexal abnormality. Other: No free fluid. No abdominopelvic fluid collection. No pneumoperitoneum. No abdominal wall hernia. Musculoskeletal: No acute or significant osseous findings. IMPRESSION: 1. There are a few fluid-filled loops of small bowel, which can be seen in the setting of a mild enteritis. 2. Cholelithiasis without evidence of acute cholecystitis. 3. Mild intra- and extrahepatic biliary dilatation. Main pancreatic duct is dilated at 6 mm. Correlate with LFTs and consider follow-up nonemergent MRCP, as clinically warranted. 4. Colonic diverticulosis without evidence of acute diverticulitis. 5. Aortic atherosclerosis (ICD10-I70.0). Electronically Signed   By: Duanne Guess D.O.   On: 12/23/2022 16:06    Procedures Procedures    Medications Ordered in ED Medications  sodium chloride 0.9 % bolus 500 mL (0 mLs Intravenous Stopped 12/23/22 1742)   ondansetron (ZOFRAN) injection 4 mg (4 mg Intravenous Given 12/23/22 1509)  famotidine (PEPCID) IVPB 20 mg premix (0 mg Intravenous Stopped 12/23/22 1735)  iohexol (OMNIPAQUE) 300 MG/ML solution 80 mL (80 mLs Intravenous Contrast Given 12/23/22 1550)    ED Course/ Medical Decision Making/ A&P                                 Medical Decision Making Pt presenting with acute on chronic n/v, upper abd pain along with less frequent episodes of diarrhea.  Has had similar sx in the past, but really intensified the past 5 weeks.  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  ? Whether this has returned.    Labs today are reassuring.  She has normal LFT's,  lipase,  she does have some mild dehydration with BUN 39, creatinine 1.52.  IV fluids given.  Non acute abd, negative Murphy;s sign.  Plan to have pt f/u with Dr Levon Hedger,  reglan for nausea, continue other home meds.   Amount and/or Complexity of Data Reviewed Labs:     Details: Labs reviewed with no significant abnormality except as mention above. LFT's and lipase normal Radiology: ordered.    Details: CT reviewed,  mild enteritis, cholelithiasis without acute cholecystitis.  Mil biliary ductal dilation (but normal LFTs and lipase).  ECG/medicine tests: ordered.  Risk Prescription drug management.           Final Clinical Impression(s) / ED Diagnoses Final diagnoses:  Nausea vomiting and diarrhea  Epigastric pain    Rx / DC Orders ED Discharge Orders          Ordered    metoCLOPramide (REGLAN) 10 MG tablet  Every 6 hours PRN        12/23/22 1730              Burgess Amor, PA-C 12/23/22 1753    Loetta Rough, MD 12/23/22 2033

## 2022-12-25 ENCOUNTER — Telehealth: Payer: Self-pay | Admitting: Gastroenterology

## 2022-12-25 NOTE — Telephone Encounter (Signed)
Husband left a message to schedule the patient for an appointment.  She was seen in the ED.  I saw the ED dr.s note about .Marland KitchenMarland KitchenPlan to have pt f/u with Dr Levon Hedger.  If you could call the patient to schedule her.

## 2022-12-26 NOTE — Telephone Encounter (Signed)
Husband left another message today asking for a call to schedule his wife.

## 2022-12-27 ENCOUNTER — Encounter: Payer: Self-pay | Admitting: Gastroenterology

## 2022-12-27 ENCOUNTER — Ambulatory Visit: Payer: Medicare Other | Admitting: Gastroenterology

## 2022-12-27 ENCOUNTER — Encounter: Payer: Self-pay | Admitting: Internal Medicine

## 2022-12-27 ENCOUNTER — Telehealth: Payer: Self-pay | Admitting: *Deleted

## 2022-12-27 VITALS — BP 185/78 | HR 49 | Temp 97.5°F | Ht 62.0 in | Wt 135.8 lb

## 2022-12-27 DIAGNOSIS — G8929 Other chronic pain: Secondary | ICD-10-CM | POA: Diagnosis not present

## 2022-12-27 DIAGNOSIS — R1013 Epigastric pain: Secondary | ICD-10-CM

## 2022-12-27 DIAGNOSIS — K529 Noninfective gastroenteritis and colitis, unspecified: Secondary | ICD-10-CM

## 2022-12-27 NOTE — Telephone Encounter (Signed)
LMOVM to call back to give MRI appt details. Tobi Bastos wanted soon and patient needed a Tuesday. Scheduled for 12/10, arrival 3:30pm, npo 4 hrs prior

## 2022-12-27 NOTE — Patient Instructions (Addendum)
We are arranging an MRI in the near future.  I have also provided samples called Creon (pancreas enzymes). Take 1 capsule WHILE eating your meals (not before or after), three times a day. You can also take 1 capsule with snacks. Let us know how this works for you!  Further recommendations to follow!  It was a pleasure to see you today. I want to create trusting relationships with patients and provide genuine, compassionate, and quality care. I truly value your feedback, so please be on the lookout for a survey regarding your visit with me today. I appreciate your time in completing this!         Gelene Mink, PhD, ANP-BC [facility] Gastroenterology

## 2022-12-27 NOTE — Progress Notes (Addendum)
Gastroenterology Office Note     Primary Care Physician:  Lawerance Sabal, [place]  Primary Gastroenterologist: Dr. Levon Hedger   Chief Complaint   Chief Complaint  Patient presents with   [facility] follow     Patient here today for a hospital follow up from recent ed visit which was on 12/23/2022. Patient still having mid abdominal pain and nausea, no vomiting. She is not taking anything at this time for this.      History of Present Illness   Katie Chan is a 72 y.o. 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 Presented to the ED on Dec 23, 2022, with abdominal pain, vomiting, and diarrhea. She is here with her husband, Katie Chan, today.   CT abd/pelvis with contrast Dec 23, 2022 with few fluid-filled loops of small bowel, mild intra and extrahepatic biliary dilatation, main pancreatic duct is dilated at 6 mm, consider MRCP.   LFTs normal. Hgb 11.2, Hct 35.7, lipase normal, UA negative.   History of chronic diarrhea. Taking QID dicyclomine. Diarrhea used to be daily, but it is better now with dicyclomine. Hoewever, she still has some difficult days and incontinence at times. Feels abdominal pain is worsening. Notes epigastric pain, always present but worsening postprandially now. No weight loss. Denies any prior history of pancreatitis.   Omeprazole once daily.   She has also had weight loss of about 5 lbs since Aug 2024, but overall she is down about 10 from last year.   Cologuard negative a year ago.    esophagogastroduodenoscopy on 11/16/2020.  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    Past Medical History:  Diagnosis Date   Carotid artery disease (HCC)    Essential hypertension    Fracture of radius, distal, right, closed 05/04/2015   GERD (gastroesophageal reflux  disease)     Past Surgical History:  Procedure Laterality Date   BALLOON DILATION  11/16/2020   Procedure: BALLOON DILATION;  Surgeon: Marguerita Merles, Reuel Boom, MD;  Location: [facility];  Service: Gastroenterology;;   ESOPHAGOGASTRODUODENOSCOPY (EGD) WITH PROPOFOL N/A 11/16/2020   Procedure: ESOPHAGOGASTRODUODENOSCOPY (EGD) WITH PROPOFOL;  Surgeon: Dolores Frame, MD;  Location: [facility];  Service: Gastroenterology;  Laterality: N/A;  7:30   OPEN REDUCTION INTERNAL FIXATION (ORIF) DISTAL RADIAL FRACTURE Right 05/04/2015   Procedure: OPEN REDUCTION INTERNAL FIXATION (ORIF) RIGHT DISTAL RADIUS FRACTURE;  Surgeon: Teryl Lucy, MD;  Location: [facility];  Service: Orthopedics;  Laterality: Right;   TONSILLECTOMY AND ADENOIDECTOMY      Current Outpatient Medications  Medication Sig Dispense Refill   amLODipine (NORVASC) 10 MG tablet Take 1 tablet (10 mg total) by mouth daily. 90 tablet 1   Blood Pressure Monitoring (OMRON 3 SERIES BP MONITOR) DEVI Use as directed 1 each 1   dicyclomine (BENTYL) 10 MG capsule Take 10 mg by mouth in the morning and at bedtime.     gabapentin (NEURONTIN) 300 MG capsule Take 300 mg by mouth daily.     hydrochlorothiazide (HYDRODIURIL) 25 MG tablet Take 25 mg by mouth every morning.     lisinopril (ZESTRIL) 40 MG tablet Take 40 mg by mouth daily.     meloxicam (MOBIC) 7.5 MG tablet Take 7.5 mg by mouth daily.     omeprazole (PRILOSEC) 20 MG capsule TAKE  ONE CAPSULE BY MOUTH ONCE DAILY 90 capsule 0   ondansetron (ZOFRAN-ODT) 4 MG disintegrating tablet SMARTSIG:Sublingual     OVER THE COUNTER MEDICATION Vit b 12 one daily     No current facility-administered medications for this visit.    Allergies as of 12/27/2022   (No Known Allergies)    Family History  Problem Relation Age of Onset   Heart disease Mother        stents placed in heart, PPM   Diabetes Mother    Heart attack Mother    Heart disease Father    Heart  attack Father    COPD Father    Diabetes Sister    Cancer Maternal Grandfather    Heart attack Paternal Grandfather 52       massive heart attack   Colon cancer Neg Hx    Colon polyps Neg Hx     Social History   Socioeconomic History   Marital status: Married    Spouse name: Not on file   Number of children: Not on file   Years of education: Not on file   Highest education level: Not on file  Occupational History   Not on file  Tobacco Use   Smoking status: Never   Smokeless tobacco: Never  Vaping Use   Vaping status: Never Used  Substance and Sexual Activity   Alcohol use: Never   Drug use: Never   Sexual activity: Not Currently  Other Topics Concern   Not on file  Social History Narrative   Not on file   Social Determinants of Health   Financial Resource Strain: Low Risk  (10/31/2021)   Received from [facility], [facility]   Overall Financial Resource Strain (CARDIA)    Difficulty of Paying Living Expenses: Not hard at all  Food Insecurity: No Food Insecurity (10/31/2021)   Received from [facility], [facility]   Hunger Vital Sign    Worried About Running Out of Food in the Last Year: Never true    Ran Out of Food in the Last Year: Never true  Transportation Needs: No Transportation Needs (10/31/2021)   Received from [facility], [facility]   [facility] - Transportation    Lack of Transportation (Medical): No    Lack of Transportation (Non-Medical): No  Physical Activity: Not on file  Stress: Not on file  Social Connections: Not on file  Intimate Partner Violence: Not on file     Review of Systems   Gen: Denies any fever, chills, fatigue, weight loss, lack of appetite.  CV: Denies chest pain, heart palpitations, peripheral edema, syncope.  Resp: Denies shortness of breath at rest or with exertion. Denies wheezing or cough.  GI: Denies dysphagia or odynophagia. Denies jaundice, hematemesis, fecal incontinence. GU : Denies urinary  burning, urinary frequency, urinary hesitancy MS: Denies joint pain, muscle weakness, cramps, or limitation of movement.  Derm: Denies rash, itching, dry skin Psych: Denies depression, anxiety, memory loss, and confusion Heme: Denies bruising, bleeding, and enlarged lymph nodes.   Physical Exam   BP (!) 185/78 (BP Location: Left Arm, Patient Position: Sitting, Cuff Size: Normal)   Pulse (!) 49   Temp (!) 97.5 F (36.4 C) (Temporal)   Ht 5\' 2"  (1.575 m)   Wt 135 lb 12.8 oz (61.6 kg)   BMI 24.84 kg/m  General:   Alert and oriented. Pleasant and cooperative. Well-nourished and well-developed.  Head:  Normocephalic and atraumatic. Eyes:  Without icterus  Abdomen:  +BS, soft, non-tender and non-distended. No HSM noted. No guarding or rebound. No masses appreciated.  Rectal:  Deferred  Msk:  Symmetrical without gross deformities. Normal posture. Extremities:  Without edema. Neurologic:  Alert and  oriented x4;  grossly normal neurologically. Skin:  Intact without significant lesions or rashes. Psych:  Alert and cooperative. Normal mood and affect.  Lab Results  Component Value Date   ALT 11 12/23/2022   AST 14 (L) 12/23/2022   ALKPHOS 53 12/23/2022   BILITOT 0.4 12/23/2022   Lab Results  Component Value Date   NA 142 12/23/2022   CL 116 (H) 12/23/2022   K 4.8 12/23/2022   CO2 20 (L) 12/23/2022   BUN 38 (H) 12/23/2022   CREATININE 1.52 (H) 12/23/2022   GFRNONAA 36 (L) 12/23/2022   CALCIUM 9.1 12/23/2022   ALBUMIN 3.5 12/23/2022   GLUCOSE 84 12/23/2022   Lab Results  Component Value Date   WBC 7.7 12/23/2022   HGB 11.2 (L) 12/23/2022   HCT 35.7 (L) 12/23/2022   MCV 100.0 12/23/2022   PLT 260 12/23/2022      Assessment   PRIM AMOAH is a 72 y.o. 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 Presented to the ED on Dec 23, 2022, with acute on chronic  epigastric pain and CT abd/pelvis with few fluid-filled loops of small bowel, mild intra and extrahepatic biliary dilatation, main pancreatic duct is dilated at 6 mm, consider MRCP.   Although LFTs are normal, I am concerned about occult stone, sludge, unable to rule out mass. Weight loss is concerning.  Will arrange expedited MRI/MRCP  With chronic diarrhea, unable to rule out underlying EPI, although historically has been felt to have IBS-D.    Cologuard negative a year ago and desires to hold off on colonoscopy.   PLAN    MRI/MRCP Creon 36k units with meals, 1 with snacks, with room to titrate up Increase PPI to BID for now Further recommendations to follow   Gelene Mink, PhD, ANP-BC [facility] Gastroenterology   I have reviewed the note and agree with the APP's assessment as described in this progress note  MRCP showed choledocholithiasis, with cholelithiasis. Referral for evaluation by Dr. Meridee Score, case discussed and will undergo ERCP soon.  Will refer for surgical evaluation for cholecystectomy.  Katrinka Blazing, MD Gastroenterology and Hepatology [facility] Gastroenterology

## 2022-12-28 ENCOUNTER — Telehealth: Payer: Self-pay | Admitting: Gastroenterology

## 2022-12-28 MED ORDER — SUCRALFATE 1 GM/10ML PO SUSP: 1.0000 g | Freq: Four times a day (QID) | ORAL | 1 refills | Status: DC

## 2022-12-28 NOTE — Addendum Note (Signed)
Addended by: Gelene Mink on: 12/28/2022 01:44 PM   Modules accepted: Orders

## 2022-12-28 NOTE — Telephone Encounter (Signed)
Let's have her increase omeprazole to twice a day, 30 minutes before meals. Stick with a soft diet, plenty of fluids to stay hydrated. I have also sent in Carafate suspension to take 4 times a day for the next 1-2 weeks.

## 2022-12-28 NOTE — Telephone Encounter (Signed)
Received VM from pt and called back and LMTCB

## 2022-12-28 NOTE — Telephone Encounter (Signed)
Called pt and she is aware of recs. She voiced understanding

## 2022-12-28 NOTE — Telephone Encounter (Signed)
Called pt and gave MRI appt details.  Patient stated yesterday when she got home, she was really hurting as she had advised Tobi Bastos at appointment. She stated she had to sleep with heating pad. She wanted to know what she can do when it is like this.

## 2022-12-28 NOTE — Telephone Encounter (Signed)
Patient left a message that she was returning a call.  I saw the note that you had entered you had called her.

## 2023-01-02 ENCOUNTER — Encounter (HOSPITAL_COMMUNITY): Payer: Self-pay

## 2023-01-02 ENCOUNTER — Ambulatory Visit (HOSPITAL_COMMUNITY)
Admission: RE | Admit: 2023-01-02 | Discharge: 2023-01-02 | Disposition: A | Discharge status: Home / Self Care | Payer: Medicare Other | Source: Ambulatory Visit | Attending: Gastroenterology | Admitting: Gastroenterology

## 2023-01-02 ENCOUNTER — Other Ambulatory Visit: Payer: Self-pay | Admitting: Gastroenterology

## 2023-01-02 DIAGNOSIS — K8070 Calculus of gallbladder and bile duct without cholecystitis without obstruction: Secondary | ICD-10-CM | POA: Diagnosis not present

## 2023-01-02 DIAGNOSIS — K529 Noninfective gastroenteritis and colitis, unspecified: Secondary | ICD-10-CM

## 2023-01-02 DIAGNOSIS — R1013 Epigastric pain: Secondary | ICD-10-CM | POA: Insufficient documentation

## 2023-01-02 DIAGNOSIS — K5730 Diverticulosis of large intestine without perforation or abscess without bleeding: Secondary | ICD-10-CM | POA: Diagnosis not present

## 2023-01-02 DIAGNOSIS — K8689 Other specified diseases of pancreas: Secondary | ICD-10-CM | POA: Diagnosis not present

## 2023-01-02 DIAGNOSIS — G8929 Other chronic pain: Secondary | ICD-10-CM

## 2023-01-02 MED ORDER — GADOBUTROL 1 MMOL/ML IV SOLN
6.0000 mL | Freq: Once | INTRAVENOUS | Status: AC | PRN
  Administered 2023-01-02: 6 mL via INTRAVENOUS

## 2023-01-08 ENCOUNTER — Telehealth: Payer: Self-pay

## 2023-01-08 NOTE — Telephone Encounter (Signed)
[facility] called with report on pt's MRCP. Results are in Epic.

## 2023-01-08 NOTE — Telephone Encounter (Signed)
Addressed under results

## 2023-01-11 ENCOUNTER — Other Ambulatory Visit: Payer: Self-pay | Admitting: *Deleted

## 2023-01-11 ENCOUNTER — Telehealth: Payer: Self-pay | Admitting: Gastroenterology

## 2023-01-11 DIAGNOSIS — K831 Obstruction of bile duct: Secondary | ICD-10-CM

## 2023-01-11 DIAGNOSIS — R9389 Abnormal findings on diagnostic imaging of other specified body structures: Secondary | ICD-10-CM

## 2023-01-11 NOTE — Telephone Encounter (Signed)
Dr. Meridee Score,  Urgent referral in [place] for ERCP. Gallstone in the CBD at the ampulla. have diffuse pancreatic atrophy and prominence of the main pancreatic duct without obstruction, consistent with history of pancreatitis and sequela of chronic pancreatitis.  Records are in EPIC.  Please review.  Thanks

## 2023-01-12 ENCOUNTER — Other Ambulatory Visit: Payer: Self-pay

## 2023-01-12 DIAGNOSIS — R9389 Abnormal findings on diagnostic imaging of other specified body structures: Secondary | ICD-10-CM

## 2023-01-12 DIAGNOSIS — K838 Other specified diseases of biliary tract: Secondary | ICD-10-CM

## 2023-01-12 DIAGNOSIS — K861 Other chronic pancreatitis: Secondary | ICD-10-CM

## 2023-01-12 NOTE — Telephone Encounter (Signed)
ERCP scheduled, pt instructed and medications reviewed.  Patient instructions mailed to home.  Patient to call with any questions or concerns.  

## 2023-01-12 NOTE — Telephone Encounter (Signed)
ERCP has been entered for 01/22/23 at 9 am at [facility] with GM   Left message on machine to call back

## 2023-01-13 NOTE — Telephone Encounter (Signed)
Thanks. GM 

## 2023-01-16 ENCOUNTER — Encounter (HOSPITAL_COMMUNITY): Payer: Self-pay | Admitting: Gastroenterology

## 2023-01-18 NOTE — Progress Notes (Signed)
Attempted to obtain medical history for pre op call via telephone, unable to reach at this time. HIPAA compliant voicemail message left requesting return call to pre surgical testing department.

## 2023-01-18 NOTE — Telephone Encounter (Signed)
Thanks [employer]. Spoke with the patient's husband and he states that she has not had any signs of cholangitis as of now.  She is still on board with the plan of endoscopic retrograde cholangiopancreatography on 12/30 Thanks

## 2023-01-18 NOTE — Telephone Encounter (Signed)
Please have her come in for labs tomorrow. Can be done at [facility] and forward to me. Thanks. GM

## 2023-01-18 NOTE — Telephone Encounter (Signed)
Hi Crystal, Can you please ask the patient to come today to have labs drawn - CBC, CMP and lipase? can have them drawn at the hospital, ordered as urgent Thanks

## 2023-01-19 ENCOUNTER — Other Ambulatory Visit (HOSPITAL_COMMUNITY)
Admission: RE | Admit: 2023-01-19 | Discharge: 2023-01-19 | Disposition: A | Discharge status: Home / Self Care | Payer: Medicare Other | Source: Ambulatory Visit | Attending: Gastroenterology | Admitting: Gastroenterology

## 2023-01-19 ENCOUNTER — Other Ambulatory Visit (INDEPENDENT_AMBULATORY_CARE_PROVIDER_SITE_OTHER): Payer: Self-pay

## 2023-01-19 DIAGNOSIS — R9389 Abnormal findings on diagnostic imaging of other specified body structures: Secondary | ICD-10-CM | POA: Insufficient documentation

## 2023-01-19 DIAGNOSIS — K831 Obstruction of bile duct: Secondary | ICD-10-CM | POA: Insufficient documentation

## 2023-01-19 DIAGNOSIS — G8929 Other chronic pain: Secondary | ICD-10-CM

## 2023-01-19 DIAGNOSIS — R112 Nausea with vomiting, unspecified: Secondary | ICD-10-CM

## 2023-01-19 DIAGNOSIS — K529 Noninfective gastroenteritis and colitis, unspecified: Secondary | ICD-10-CM

## 2023-01-19 LAB — COMPREHENSIVE METABOLIC PANEL
ALT: 11 U/L (ref 0–44)
AST: 15 U/L (ref 15–41)
Albumin: 4 g/dL (ref 3.5–5.0)
Alkaline Phosphatase: 57 U/L (ref 38–126)
Anion gap: 6 (ref 5–15)
BUN: 27 mg/dL — ABNORMAL HIGH (ref 8–23)
CO2: 22 mmol/L (ref 22–32)
Calcium: 9.2 mg/dL (ref 8.9–10.3)
Chloride: 112 mmol/L — ABNORMAL HIGH (ref 98–111)
Creatinine, Ser: 1.16 mg/dL — ABNORMAL HIGH (ref 0.44–1.00)
GFR, Estimated: 50 mL/min — ABNORMAL LOW (ref >60)
Glucose, Bld: 108 mg/dL — ABNORMAL HIGH (ref 70–99)
Potassium: 4.1 mmol/L (ref 3.5–5.1)
Sodium: 140 mmol/L (ref 135–145)
Total Bilirubin: 0.6 mg/dL (ref <1.2)
Total Protein: 6.5 g/dL (ref 6.5–8.1)

## 2023-01-19 LAB — HEPATIC FUNCTION PANEL
ALT: 11 U/L (ref 0–44)
AST: 13 U/L — ABNORMAL LOW (ref 15–41)
Albumin: 3.9 g/dL (ref 3.5–5.0)
Alkaline Phosphatase: 56 U/L (ref 38–126)
Bilirubin, Direct: 0.1 mg/dL (ref 0.0–0.2)
Indirect Bilirubin: 0.4 mg/dL (ref 0.3–0.9)
Total Bilirubin: 0.5 mg/dL (ref <1.2)
Total Protein: 6.5 g/dL (ref 6.5–8.1)

## 2023-01-19 LAB — CBC WITH DIFFERENTIAL/PLATELET
Abs Immature Granulocytes: 0.02 10*3/uL (ref 0.00–0.07)
Basophils Absolute: 0 10*3/uL (ref 0.0–0.1)
Basophils Relative: 1 %
Eosinophils Absolute: 0.5 10*3/uL (ref 0.0–0.5)
Eosinophils Relative: 8 %
HCT: 34.5 % — ABNORMAL LOW (ref 36.0–46.0)
Hemoglobin: 10.9 g/dL — ABNORMAL LOW (ref 12.0–15.0)
Immature Granulocytes: 0 %
Lymphocytes Relative: 32 %
Lymphs Abs: 2 10*3/uL (ref 0.7–4.0)
MCH: 31.2 pg (ref 26.0–34.0)
MCHC: 31.6 g/dL (ref 30.0–36.0)
MCV: 98.9 fL (ref 80.0–100.0)
Monocytes Absolute: 0.5 10*3/uL (ref 0.1–1.0)
Monocytes Relative: 8 %
Neutro Abs: 3.1 10*3/uL (ref 1.7–7.7)
Neutrophils Relative %: 51 %
Platelets: 230 10*3/uL (ref 150–400)
RBC: 3.49 MIL/uL — ABNORMAL LOW (ref 3.87–5.11)
RDW: 13.3 % (ref 11.5–15.5)
WBC: 6.1 10*3/uL (ref 4.0–10.5)
nRBC: 0 % (ref 0.0–0.2)

## 2023-01-19 LAB — LIPASE, BLOOD: Lipase: 28 U/L (ref 11–51)

## 2023-01-19 NOTE — Telephone Encounter (Signed)
I looked in the patient's chart to see if her labs were back, and see that the Hepatic function panel was back that was ordered by another Doctor, but the stat labs cbc,cmp, lipase were not showing they were drawn.  I called and spoke with Morrie Sheldon at the lab at [facility] as I had spoken with them this am and they told me to fax the lab orders over to them, which I did.She says she is unsure if the labs we had faxed over were done, she would have to speak with her co worker to see if she had drawn these labs on her. She will call us back with whether or not the labs we ordered were drawn. I advised that we close at 11 am and would need to know if they were done or not before then. She will call our office back with what she finds out. I have tried calling the patient and left a message on her vm to call the office back, I did leave message that we are here until 11 am.

## 2023-01-19 NOTE — Telephone Encounter (Signed)
Thanks

## 2023-01-19 NOTE — Telephone Encounter (Signed)
Orders faxed to [facility] lab at [phone].

## 2023-01-19 NOTE — Telephone Encounter (Signed)
I spoke with the patient and she is aware she will need to go today to [facility] for Stat labs.

## 2023-01-19 NOTE — Telephone Encounter (Signed)
I spoke with Sunny Schlein at [facility] and she says they did not draw the labs we had ordered,but they were adding on the CMP and the Lipase and they had reached out to the patient and let her know she would need to come back to their lab for them to drawn the cbc, she says the patient was made aware of this and had told them she would be back there today before 3 pm. I have called and left a detailed message to the patient that she would need to go back to [facility] for one more test, and we were sorry for any inconvenience. I asked that she please call me back to let me know she did received this message,to leave it on my Vm as we leave the office at 11:30 am.

## 2023-01-22 ENCOUNTER — Ambulatory Visit (HOSPITAL_COMMUNITY): Payer: Medicare Other | Admitting: Anesthesiology

## 2023-01-22 ENCOUNTER — Encounter (HOSPITAL_COMMUNITY)
Admission: RE | Disposition: A | Discharge status: Home / Self Care | Payer: Self-pay | Source: Home / Self Care | Attending: Gastroenterology

## 2023-01-22 ENCOUNTER — Other Ambulatory Visit: Payer: Self-pay

## 2023-01-22 ENCOUNTER — Ambulatory Visit (HOSPITAL_BASED_OUTPATIENT_CLINIC_OR_DEPARTMENT_OTHER): Payer: Medicare Other | Admitting: Anesthesiology

## 2023-01-22 ENCOUNTER — Ambulatory Visit (HOSPITAL_COMMUNITY): Payer: Medicare Other

## 2023-01-22 ENCOUNTER — Encounter (HOSPITAL_COMMUNITY): Payer: Self-pay | Admitting: Gastroenterology

## 2023-01-22 ENCOUNTER — Ambulatory Visit (HOSPITAL_COMMUNITY)
Admission: RE | Admit: 2023-01-22 | Discharge: 2023-01-22 | Disposition: A | Discharge status: Home / Self Care | Payer: Medicare Other | Attending: Gastroenterology | Admitting: Gastroenterology

## 2023-01-22 DIAGNOSIS — K269 Duodenal ulcer, unspecified as acute or chronic, without hemorrhage or perforation: Secondary | ICD-10-CM | POA: Insufficient documentation

## 2023-01-22 DIAGNOSIS — K831 Obstruction of bile duct: Secondary | ICD-10-CM | POA: Diagnosis not present

## 2023-01-22 DIAGNOSIS — K259 Gastric ulcer, unspecified as acute or chronic, without hemorrhage or perforation: Secondary | ICD-10-CM | POA: Diagnosis not present

## 2023-01-22 DIAGNOSIS — R932 Abnormal findings on diagnostic imaging of liver and biliary tract: Secondary | ICD-10-CM | POA: Diagnosis not present

## 2023-01-22 DIAGNOSIS — K219 Gastro-esophageal reflux disease without esophagitis: Secondary | ICD-10-CM | POA: Insufficient documentation

## 2023-01-22 DIAGNOSIS — K861 Other chronic pancreatitis: Secondary | ICD-10-CM

## 2023-01-22 DIAGNOSIS — K8050 Calculus of bile duct without cholangitis or cholecystitis without obstruction: Secondary | ICD-10-CM | POA: Diagnosis not present

## 2023-01-22 DIAGNOSIS — I10 Essential (primary) hypertension: Secondary | ICD-10-CM | POA: Diagnosis not present

## 2023-01-22 DIAGNOSIS — K8051 Calculus of bile duct without cholangitis or cholecystitis with obstruction: Secondary | ICD-10-CM | POA: Diagnosis present

## 2023-01-22 DIAGNOSIS — K838 Other specified diseases of biliary tract: Secondary | ICD-10-CM | POA: Diagnosis not present

## 2023-01-22 DIAGNOSIS — K3189 Other diseases of stomach and duodenum: Secondary | ICD-10-CM | POA: Insufficient documentation

## 2023-01-22 DIAGNOSIS — K315 Obstruction of duodenum: Secondary | ICD-10-CM | POA: Diagnosis not present

## 2023-01-22 DIAGNOSIS — R9389 Abnormal findings on diagnostic imaging of other specified body structures: Secondary | ICD-10-CM

## 2023-01-22 HISTORY — PX: ERCP: SHX5425

## 2023-01-22 HISTORY — PX: BIOPSY: SHX5522

## 2023-01-22 HISTORY — PX: ESOPHAGOGASTRODUODENOSCOPY: SHX5428

## 2023-01-22 SURGERY — EGD (ESOPHAGOGASTRODUODENOSCOPY)
Anesthesia: General

## 2023-01-22 MED ORDER — OMEPRAZOLE 40 MG PO CPDR: 40.0000 mg | DELAYED_RELEASE_CAPSULE | Freq: Two times a day (BID) | ORAL | 12 refills | Status: AC

## 2023-01-22 MED ORDER — PHENYLEPHRINE 80 MCG/ML (10ML) SYRINGE FOR IV PUSH (FOR BLOOD PRESSURE SUPPORT)
PREFILLED_SYRINGE | INTRAVENOUS | Status: DC | PRN
  Administered 2023-01-22: 160 ug via INTRAVENOUS

## 2023-01-22 MED ORDER — EPHEDRINE SULFATE-NACL 50-0.9 MG/10ML-% IV SOSY
PREFILLED_SYRINGE | INTRAVENOUS | Status: DC | PRN
  Administered 2023-01-22: 10 mg via INTRAVENOUS
  Administered 2023-01-22: 15 mg via INTRAVENOUS

## 2023-01-22 MED ORDER — SUCRALFATE 1 G PO TABS: 1.0000 g | ORAL_TABLET | Freq: Four times a day (QID) | ORAL | 6 refills | Status: DC

## 2023-01-22 MED ORDER — FENTANYL CITRATE (PF) 100 MCG/2ML IJ SOLN
INTRAMUSCULAR | Status: AC
  Filled 2023-01-22: qty 2

## 2023-01-22 MED ORDER — ROCURONIUM BROMIDE 10 MG/ML (PF) SYRINGE
PREFILLED_SYRINGE | INTRAVENOUS | Status: DC | PRN
  Administered 2023-01-22: 50 mg via INTRAVENOUS

## 2023-01-22 MED ORDER — DEXAMETHASONE SODIUM PHOSPHATE 10 MG/ML IJ SOLN
INTRAMUSCULAR | Status: DC | PRN
  Administered 2023-01-22: 10 mg via INTRAVENOUS

## 2023-01-22 MED ORDER — DICLOFENAC SUPPOSITORY 100 MG
RECTAL | Status: AC
  Filled 2023-01-22: qty 1

## 2023-01-22 MED ORDER — CIPROFLOXACIN IN D5W 400 MG/200ML IV SOLN
INTRAVENOUS | Status: AC
  Filled 2023-01-22: qty 200

## 2023-01-22 MED ORDER — GLUCAGON HCL RDNA (DIAGNOSTIC) 1 MG IJ SOLR
INTRAMUSCULAR | Status: AC
  Filled 2023-01-22: qty 1

## 2023-01-22 MED ORDER — SODIUM CHLORIDE 0.9 % IV SOLN: INTRAVENOUS | Status: DC

## 2023-01-22 MED ORDER — SODIUM CHLORIDE 0.9 % IV SOLN
INTRAVENOUS | Status: DC | PRN
  Administered 2023-01-22: 30 mL

## 2023-01-22 MED ORDER — PROPOFOL 1000 MG/100ML IV EMUL
INTRAVENOUS | Status: AC
  Filled 2023-01-22: qty 100

## 2023-01-22 MED ORDER — ONDANSETRON HCL 4 MG/2ML IJ SOLN
INTRAMUSCULAR | Status: DC | PRN
  Administered 2023-01-22: 4 mg via INTRAVENOUS

## 2023-01-22 MED ORDER — FENTANYL CITRATE (PF) 100 MCG/2ML IJ SOLN
INTRAMUSCULAR | Status: DC | PRN
  Administered 2023-01-22: 50 ug via INTRAVENOUS

## 2023-01-22 MED ORDER — LIDOCAINE 2% (20 MG/ML) 5 ML SYRINGE
INTRAMUSCULAR | Status: DC | PRN
  Administered 2023-01-22: 100 mg via INTRAVENOUS

## 2023-01-22 MED ORDER — LACTATED RINGERS IV SOLN: INTRAVENOUS | Status: DC

## 2023-01-22 MED ORDER — PROPOFOL 10 MG/ML IV BOLUS
INTRAVENOUS | Status: DC | PRN
  Administered 2023-01-22: 200 mg via INTRAVENOUS

## 2023-01-22 MED ORDER — DICLOFENAC SUPPOSITORY 100 MG
RECTAL | Status: DC | PRN
  Administered 2023-01-22: 100 mg via RECTAL

## 2023-01-22 MED ORDER — SUGAMMADEX SODIUM 200 MG/2ML IV SOLN
INTRAVENOUS | Status: DC | PRN
  Administered 2023-01-22: 400 mg via INTRAVENOUS

## 2023-01-22 MED ORDER — CIPROFLOXACIN IN D5W 400 MG/200ML IV SOLN
INTRAVENOUS | Status: DC | PRN
  Administered 2023-01-22: 400 mg via INTRAVENOUS

## 2023-01-22 NOTE — Discharge Instructions (Addendum)
Begin taking your new omeprazole (40 mg) twice a day. Begin taking the carafate tablets.  YOU HAD AN ENDOSCOPIC PROCEDURE TODAY: Refer to the procedure report and other information in the discharge instructions given to you for any specific questions about what was found during the examination. If this information does not answer your questions, please call [place] office at [phone] to clarify.   YOU SHOULD EXPECT: Some feelings of bloating in the abdomen. Passage of more gas than usual. Walking can help get rid of the air that was put into your GI tract during the procedure and reduce the bloating.   DIET: Your first meal following the procedure should be a light meal and then it is ok to progress to your normal diet. A half-sandwich or bowl of soup is an example of a good first meal. Heavy or fried foods are harder to digest and may make you feel nauseous or bloated. Drink plenty of fluids but you should avoid alcoholic beverages for 24 hours. If you had a esophageal dilation, please see attached instructions for diet.    ACTIVITY: Your care partner should take you home directly after the procedure. You should plan to take it easy, moving slowly for the rest of the day. You can resume normal activity the day after the procedure however YOU SHOULD NOT DRIVE, use power tools, machinery or perform tasks that involve climbing or major physical exertion for 24 hours (because of the sedation medicines used during the test).   SYMPTOMS TO REPORT IMMEDIATELY: A gastroenterologist can be reached at any hour. Please call [phone]  for any of the following symptoms:   Following upper endoscopy (EGD, EUS, ERCP, esophageal dilation) Vomiting of blood or coffee ground material  New, significant abdominal pain  New, significant chest pain or pain under the shoulder blades  Painful or persistently difficult swallowing  New shortness of breath  Black, tarry-looking or red, bloody stools  FOLLOW UP:   If any biopsies were taken you will be contacted by phone or by letter within the next 1-3 weeks. Call [phone]  if you have not heard about the biopsies in 3 weeks.  Please also call with any specific questions about appointments or follow up tests.

## 2023-01-22 NOTE — Transfer of Care (Addendum)
Immediate Anesthesia Transfer of Care Note  Patient: Katie Chan  Procedure(s) Performed: ESOPHAGOGASTRODUODENOSCOPY (EGD) Balloon dilation wire-guided BIOPSY aborted ERCP  Patient Location: PACU  Anesthesia Type:General  Level of Consciousness: sedated  Airway & Oxygen Therapy: Patient Spontanous Breathing and Patient connected to face mask oxygen  Post-op Assessment: Report given to RN and Post -op Vital signs reviewed and stable  Post vital signs: Reviewed and stable  Last Vitals:  Vitals Value Taken Time  BP 137/48 01/22/23 0920  Temp    Pulse 59 01/22/23 0920  Resp 15 01/22/23 0920  SpO2 100 % 01/22/23 0920  Vitals shown include unfiled device data.  Last Pain:  Vitals:   01/22/23 0743  TempSrc: Temporal  PainSc: 0-No pain         Complications: No notable events documented.

## 2023-01-22 NOTE — Op Note (Signed)
[facility] Patient Name: Katie Chan Procedure Date: 01/22/2023 MRN: [id number] Attending MD: Corliss Parish , MD, [id number] Date of Birth: [birth year] CSN: [id number] Age: 72 Admit Type: Outpatient Procedure:                ERCP Indications:              Bile duct stone(s), Epigastric abdominal pain,                            Abnormal MRCP Providers:                Corliss Parish, MD, Norman Clay, RN, Harrington Challenger,                            Technician Referring MD:             Katrinka Blazing Medicines:                General Anesthesia, Diclofenac 100 mg rectal, Cipro                            400 mg IV Complications:            No immediate complications. Estimated Blood Loss:     Estimated blood loss was minimal. Procedure:                Pre-Anesthesia Assessment:                           - Prior to the procedure, a History and Physical                            was performed, and patient medications and                            allergies were reviewed. The patient's tolerance of                            previous anesthesia was also reviewed. The risks                            and benefits of the procedure and the sedation                            options and risks were discussed with the patient.                            All questions were answered, and informed consent                            was obtained. Prior Anticoagulants: The patient has                            taken no anticoagulant or antiplatelet agents                            except for NSAID  medication. ASA Grade Assessment:                            III - A patient with severe systemic disease. After                            reviewing the risks and benefits, the patient was                            deemed in satisfactory condition to undergo the                            procedure.                           After obtaining informed consent, the scope was                             passed under direct vision. Throughout the                            procedure, the patient's blood pressure, pulse, and                            oxygen saturations were monitored continuously. The                            [id number]-Q190V ([id number]) Olympus duodenoscope was                            introduced through the mouth, The procedure was                            aborted. The scope was not inserted. bile ducts                            Medications were duodenum. The GIF-H190 ([id number])                            Olympus endoscope was introduced through the mouth,                            and used to inject contrast into and used to locate                            the major papilla. The ERCP was accomplished                            without difficulty. The patient tolerated the                            procedure. Scope In: Scope Out: Findings:      The scout film was normal.      A standard esophagogastroduodenoscopy scope was used for the examination  of the upper gastrointestinal tract. The scope was passed under direct       vision through the upper GI tract. No gross lesions were noted in the       entire esophagus. The Z-line was regular and was found 38 cm from the       incisors. Patchy mildly erythematous mucosa without bleeding was found       in the entire examined stomach -biopsied for H. pylori assessment. One       non-bleeding cratered duodenal ulcer with a clean ulcer base (Forrest       Class III) was found in the duodenal bulb apex. The lesion was 15 mm in       largest dimension. The duodenal ulcer led to an acquired severe stenosis       was found in the duodenal bulb (approximately 6 mm in size/diameter).       Initially this could not be traversed with the adult endoscope.       Subsequently a TTS dilator was passed through the scope. Dilation with       an 08-31-08 mm and a 11-03-10 mm pyloric balloon dilator was performed        under fluoroscopic guidance in the entire duodenum. This led to       significant mucosal wrent/disruption. We could traverse the adult       endoscope thereafter after dilation. Looking with the forward-viewing       endoscope, the major papilla was congested. I transition back to the       duodena scope, but I could not get this to pass (not unlikely, since we       need at least a 14 to 15 mm region at minimum and most of our duodena       scope's). Thus tempt at bile duct cannulation could not be pursued       further and was not successful. The duodenoscope was withdrawn from the       patient. Impression:               - No gross lesions in the entire esophagus. Z-line                            regular, 38 cm from the incisors.                           - Erythematous mucosa in the stomach. Biopsied for                            H. pylori assessment.                           - Non-bleeding duodenal ulcer with a clean ulcer                            base (Forrest Class III) found in the duodenal                            apex. 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 Dilation performed of the                            ulcerated region and stenosis from 6 mm up to 12 mm                            (mucosal wrent/disruption noted). Stenosis could be                            traversed by the adult endoscope (duodena scope                            could not be passed).                           - The major papilla appeared congested.                           - Unsuccessful at attempt at biliary cannulation                            due to duodenal stenosis. Moderate Sedation:      Not Applicable - Patient had care per Anesthesia. Recommendation:           - The patient will be observed post-procedure,                            until all discharge criteria are met.                           - Discharge patient to home.                            - Patient has a contact number available for                            emergencies. The signs and symptoms of potential                            delayed complications were discussed with the                            patient. Return to normal activities tomorrow.                            Written discharge instructions were provided to the                            patient.                           - Resume previous diet.                           - Increase PPI to 40 mg twice daily (new  prescription has been sent).                           - Continue current Carafate therapy, I am changing                            the patient to Carafate tablets, as I think this                            will be more effective for her since the GI tract                            ulceration is in the duodenum rather than the                            esophagus.                           - Minimize NSAID therapy as able.                           - Hopefully this will allow [place] an opportunity to                            heal the ulcer, cause more of a fibrotic stenosis,                            that will then allow [place] to further dilate and go                            into the second portion of the duodenum for attempt                            at ERCP and stone extraction.                           - Unfortunately, based on my availability I am not                            sure that I will have the ability to plan repeat                            ERCP with dilation of the duodenal stricture for                            attempt at choledocholithiasis removal in the next                            few weeks. I will discuss with my partners whether                            they may have availability versus if patient will  just need to wait or have referral to quaternary                            center. Also  could have patient's primary                            gastroenterologist plan to further dilate duodenal                            stricture so that this may be easier for [place] in the                            subsequent ERCP attempt.                           - Observe patient's clinical course.                           - Trend LFT pattern, if patient has significant                            recurrent abdominal pain, to see if she has become                            obstructed where she may need PBD or repeat earlier                            ERCP attempt with duodenal stricture dilation.                           - Watch for pancreatitis, bleeding, perforation,                            and cholangitis.                           - The findings and recommendations were discussed                            with the patient.                           - The findings and recommendations were discussed                            with the patient's family. Procedure Code(s):        --- Professional ---                           [phone], Esophagogastroduodenoscopy, flexible,                            transoral; with dilation of gastric/duodenal  stricture(s) (eg, balloon, bougie)                           74360, 26, Intraluminal dilation of strictures                            and/or obstructions (eg, esophagus), radiological                            supervision and interpretation Diagnosis Code(s):        --- Professional ---                           K31.89, Other diseases of stomach and duodenum                           K26.9, Duodenal ulcer, unspecified as acute or                            chronic, without hemorrhage or perforation                           K31.5, Obstruction of duodenum                           K80.50, Calculus of bile duct without cholangitis                            or cholecystitis without obstruction                            R10.13, Epigastric pain                           K83.8, Other specified diseases of biliary tract                           R93.2, Abnormal findings on diagnostic imaging of                            liver and biliary tract CPT copyright 2022 American Medical Association. All rights reserved. The codes documented in this report are preliminary and upon coder review may  be revised to meet current compliance requirements. Corliss Parish, MD 01/22/2023 9:37:56 AM Number of Addenda: 0

## 2023-01-22 NOTE — Anesthesia Procedure Notes (Signed)
Procedure Name: Intubation Date/Time: 01/22/2023 8:29 AM  Performed by: Doran Clay, CRNAPre-anesthesia Checklist: Patient identified, Emergency Drugs available, Suction available, Patient being monitored and Timeout performed Patient Re-evaluated:Patient Re-evaluated prior to induction Oxygen Delivery Method: Circle system utilized Preoxygenation: Pre-oxygenation with 100% oxygen Induction Type: IV induction Ventilation: Mask ventilation without difficulty Laryngoscope Size: Mac and 3 Grade View: Grade I Tube type: Oral Tube size: 7.0 mm Number of attempts: 1 Airway Equipment and Method: Stylet Placement Confirmation: ETT inserted through vocal cords under direct vision, breath sounds checked- equal and bilateral and positive ETCO2 Secured at: 21 cm Tube secured with: Tape Dental Injury: Teeth and Oropharynx as per pre-operative assessment

## 2023-01-22 NOTE — H&P (Signed)
GASTROENTEROLOGY PROCEDURE H&P NOTE   Primary Care Physician: Lawerance Sabal, PA  HPI: Katie Chan is a 72 y.o. female who presents for ERCP for attempt at removal of choledocholithiasis.  Past Medical History:  Diagnosis Date   Carotid artery disease (HCC)    Essential hypertension    Fracture of radius, distal, right, closed 05/04/2015   GERD (gastroesophageal reflux disease)    Past Surgical History:  Procedure Laterality Date   BALLOON DILATION  11/16/2020   Procedure: BALLOON DILATION;  Surgeon: Marguerita Merles, Reuel Boom, MD;  Location: [facility];  Service: Gastroenterology;;   ESOPHAGOGASTRODUODENOSCOPY (EGD) WITH PROPOFOL N/A 11/16/2020   Procedure: ESOPHAGOGASTRODUODENOSCOPY (EGD) WITH PROPOFOL;  Surgeon: Dolores Frame, MD;  Location: [facility];  Service: Gastroenterology;  Laterality: N/A;  7:30   OPEN REDUCTION INTERNAL FIXATION (ORIF) DISTAL RADIAL FRACTURE Right 05/04/2015   Procedure: OPEN REDUCTION INTERNAL FIXATION (ORIF) RIGHT DISTAL RADIUS FRACTURE;  Surgeon: Teryl Lucy, MD;  Location: [facility];  Service: Orthopedics;  Laterality: Right;   TONSILLECTOMY AND ADENOIDECTOMY     Current Facility-Administered Medications  Medication Dose Route Frequency Provider Last Rate Last Admin   0.9 %  sodium chloride infusion   Intravenous Continuous Mansouraty, Netty Starring., MD       lactated ringers infusion   Intravenous Continuous Mansouraty, Netty Starring., MD 20 mL/hr at 01/22/23 0751 Continued from Pre-op at 01/22/23 0751    Current Facility-Administered Medications:    0.9 %  sodium chloride infusion, , Intravenous, Continuous, Mansouraty, Netty Starring., MD   lactated ringers infusion, , Intravenous, Continuous, Mansouraty, Netty Starring., MD, Last Rate: 20 mL/hr at 01/22/23 0751, Continued from Pre-op at 01/22/23 0751 No Known Allergies Family History  Problem Relation Age of Onset   Heart disease Mother        stents placed in  heart, PPM   Diabetes Mother    Heart attack Mother    Heart disease Father    Heart attack Father    COPD Father    Diabetes Sister    Cancer Maternal Grandfather    Heart attack Paternal Grandfather 53       massive heart attack   Colon cancer Neg Hx    Colon polyps Neg Hx    Social History   Socioeconomic History   Marital status: Married    Spouse name: Not on file   Number of children: Not on file   Years of education: Not on file   Highest education level: Not on file  Occupational History   Not on file  Tobacco Use   Smoking status: Never   Smokeless tobacco: Never  Vaping Use   Vaping status: Never Used  Substance and Sexual Activity   Alcohol use: Never   Drug use: Never   Sexual activity: Not Currently  Other Topics Concern   Not on file  Social History Narrative   Not on file   Social Drivers of Health   Financial Resource Strain: Low Risk  (10/31/2021)   Received [facility], [facility]   Overall Financial Resource Strain (CARDIA)    Difficulty of Paying Living Expenses: Not hard at all  Food Insecurity: No Food Insecurity (10/31/2021)   Received from [facility], [facility]   Hunger Vital Sign    Worried About Running Out of Food in the Last Year: Never true    Ran Out of Food in the Last Year: Never true  Transportation Needs: No  Transportation Needs (10/31/2021)   Received from [facility], [facility]   [facility] - Transportation    Lack of Transportation (Medical): No    Lack of Transportation (Non-Medical): No  Physical Activity: Not on file  Stress: Not on file  Social Connections: Not on file  Intimate Partner Violence: Not on file    Physical Exam: Today's Vitals   01/22/23 0743  BP: (!) 169/63  Pulse: (!) 47  Resp: 15  Temp: (!) 97.3 F (36.3 C)  TempSrc: Temporal  SpO2: 99%  Weight: 63.5 kg  Height: 5\' 2"  (1.575 m)  PainSc: 0-No pain   Body mass index is 25.61 kg/m. GEN: NAD EYE: Sclerae  anicteric ENT: MMM CV: Non-tachycardic GI: Soft, NT/ND NEURO:  Alert & Oriented x 3  Lab Results: Recent Labs    01/19/23 1417  WBC 6.1  HGB 10.9*  HCT 34.5*  PLT 230   BMET Recent Labs    01/19/23 1417  NA 140  K 4.1  CL 112*  CO2 22  GLUCOSE 108*  BUN 27*  CREATININE 1.16*  CALCIUM 9.2   LFT Recent Labs    01/19/23 0916 01/19/23 1417  PROT 6.5 6.5  ALBUMIN 3.9 4.0  AST 13* 15  ALT 11 11  ALKPHOS 56 57  BILITOT 0.5 0.6  BILIDIR 0.1  --   IBILI 0.4  --    PT/INR No results for input(s): "LABPROT", "INR" in the last 72 hours.   Impression / Plan: This is a 72 y.o.female who presents for ERCP for attempt at removal of choledocholithiasis.  The risks of an ERCP were discussed at length, including but not limited to the risk of perforation, bleeding, abdominal pain, post-ERCP pancreatitis (while usually mild can be severe and even life threatening).   The risks and benefits of endoscopic evaluation/treatment were discussed with the patient and/or family; these include but are not limited to the risk of perforation, infection, bleeding, missed lesions, lack of diagnosis, severe illness requiring hospitalization, as well as anesthesia and sedation related illnesses.  The patient's history has been reviewed, patient examined, no change in status, and deemed stable for procedure.  The patient and/or family is agreeable to proceed.    Corliss Parish, MD [facility] Office # [phone]

## 2023-01-22 NOTE — Anesthesia Preprocedure Evaluation (Signed)
Anesthesia Evaluation  Patient identified by MRN, date of birth, ID band Patient awake    Reviewed: Allergy & Precautions, NPO status , Patient's Chart, lab work & pertinent test results  History of Anesthesia Complications (+) PONV and history of anesthetic complications  Airway Mallampati: II  TM Distance: >3 FB Neck ROM: Full    Dental  (+) Dental Advisory Given, Missing, Partial Lower   Pulmonary neg pulmonary ROS   breath sounds clear to auscultation       Cardiovascular Exercise Tolerance: Good hypertension, Pt. on medications + Peripheral Vascular Disease   Rhythm:Regular Rate:Bradycardia     Neuro/Psych negative neurological ROS  negative psych ROS   GI/Hepatic Neg liver ROS,GERD  Medicated and Controlled,,  Endo/Other  negative endocrine ROS    Renal/GU Renal InsufficiencyRenal disease  negative genitourinary   Musculoskeletal negative musculoskeletal ROS (+)    Abdominal Normal abdominal exam  (+)   Peds negative pediatric ROS (+)  Hematology  (+) Blood dyscrasia, anemia   Anesthesia Other Findings   Reproductive/Obstetrics negative OB ROS                             Anesthesia Physical Anesthesia Plan  ASA: 2  Anesthesia Plan: General   Post-op Pain Management:    Induction: Intravenous  PONV Risk Score and Plan: 4 or greater and Ondansetron and Dexamethasone  Airway Management Planned: Oral ETT  Additional Equipment: None  Intra-op Plan:   Post-operative Plan: Extubation in OR  Informed Consent: I have reviewed the patients History and Physical, chart, labs and discussed the procedure including the risks, benefits and alternatives for the proposed anesthesia with the patient or authorized representative who has indicated his/her understanding and acceptance.     Dental advisory given  Plan Discussed with: CRNA  Anesthesia Plan Comments:          Anesthesia Quick Evaluation

## 2023-01-22 NOTE — Anesthesia Postprocedure Evaluation (Signed)
Anesthesia Post Note  Patient: MISHELE WEIK  Procedure(s) Performed: ESOPHAGOGASTRODUODENOSCOPY (EGD) Balloon dilation wire-guided BIOPSY ENDOSCOPIC RETROGRADE CHOLANGIOPANCREATOGRAPHY (ERCP)     Patient location during evaluation: Endoscopy Anesthesia Type: General Level of consciousness: awake Pain management: pain level controlled Vital Signs Assessment: post-procedure vital signs reviewed and stable Respiratory status: spontaneous breathing Cardiovascular status: stable Postop Assessment: no apparent nausea or vomiting Anesthetic complications: no  No notable events documented.  Last Vitals:  Vitals:   01/22/23 0920 01/22/23 0930  BP: (!) 137/48 (!) 128/48  Pulse: 61 62  Resp: 15 14  Temp: (!) 36.3 C   SpO2: 100% 96%    Last Pain:  Vitals:   01/22/23 0930  TempSrc:   PainSc: 0-No pain                 Caren Macadam

## 2023-01-23 ENCOUNTER — Telehealth (INDEPENDENT_AMBULATORY_CARE_PROVIDER_SITE_OTHER): Payer: Self-pay | Admitting: Gastroenterology

## 2023-01-23 DIAGNOSIS — K315 Obstruction of duodenum: Secondary | ICD-10-CM

## 2023-01-23 LAB — SURGICAL PATHOLOGY

## 2023-01-23 NOTE — Telephone Encounter (Signed)
Left message to return call 

## 2023-01-23 NOTE — Addendum Note (Signed)
Addended by: Marlowe Shores on: 01/23/2023 09:27 AM   Modules accepted: Orders

## 2023-01-23 NOTE — Telephone Encounter (Signed)
Pt returned call. Pt scheduled for 02/13/23. Instructions will be mailed once pre op received. Will do pa after first of year.

## 2023-01-23 NOTE — Telephone Encounter (Signed)
 Katie Angelia Sieving, MD  Katie Chan, Katie Raddle., MD; Katie Norleen SAILOR, MD; Avram Katie BRAVO, MD; Katie Groom, MD; Katie Odetta CROME, RN; 1 other Thanks for the help [facility], unfortunately the stricture did not allow passage of the duodenoscope. Will get her scheduled for repeat EGD in 3-4 weeks and dilate until she reaches a decent diameter.  Glenys, can you please schedule an EGD in 3 weeks? Dx: duodenal stricture. Room: any  Thanks,  Chan Eartha, MD Gastroenterology and Hepatology [facility] Gastroenterology       Previous Messages    ----- Message ----- From: Wilhelmenia Katie Raddle., MD Sent: 01/22/2023  12:15 PM EST To: Katie Chan Avram, MD; Norleen Chan Abran, MD; * Subject: RE: Followup ERCP plans                        JP, Agree with that. I placed Dr. Eartha on here (her primary GI) to see if he could go ahead and get her dilated. Hopefully sooner rather than later. Just with our schedules, can be difficult to get something so many weeks out if we don't get on the books.  DCM, Let us  know know what you can do. In interim, will get on the books with me or one of us , but if you can get that stricture dilated further it will be significantly helpful for us  to not have another aborted ERCP. GM ----- Message ----- From: Katie Norleen SAILOR, MD Sent: 01/22/2023  11:43 AM EST To: Katie Chan Avram, MD; Odetta Chan Anitra, RN; * Subject: RE: Followup ERCP plans                        Gabe, You might consider having a non-ERCP doc(s) repeat endoscopy at some point to assure ulcer healing and adequate dilation of the stricture.  She may need repeat dilation sessions, which they can perform.  At a point when the ulcer is healed and an ERCP scope could be passed effectively and safely, we could check our schedules regarding ERCP with ductal clearance of stone disease.  In the interim, you should place her on your future schedule just to save a spot. Thanks, JP ----- Message  ----- From: Wilhelmenia Katie Raddle., MD Sent: 01/22/2023  11:17 AM EST To: Katie Chan Avram, MD; Norleen Chan Abran, MD; * Subject: Followup ERCP plans                            DCM, Attempted to do your patient's procedure today however her duodenal stricture at the apex was quite tight (6 mm) I only got her up to 12 mm with her active ulcer in place as well. Unfortunately we cannot pass a ERCP scope until its at least 14 to 15 mm. I have adjusted her PPI to 40 mg twice daily and adjusted her Carafate  to tablet version as this may be more helpful in healing the ulcer in the duodenum rather than liquid. Unfortunately she still has choledocholithiasis. I need you to see if you are willing or one of your partners to help with dilating her further probably in 3 weeks so that we do not have the same issue moving forward. Can you help with this? GM  JP and CG and RG, This is a patient that was referred by Dr. Eartha and I attempted to do ERCP today, but due to a duodenal stricture and  duodenal ulcer I could not traverse into D2. We are going to try to heal up her ulcer, but she needs a repeat attempt at ERCP. With my family situation, I am not can have availability likely until end of February or March. Do any of you have availability in the next 4 to 6 weeks, after we get her further dilated in regards to her duodenal stricture? I will be happy to put her on my schedule but right now it is going to be most likely March. Please reply as able.  GM  Patty, Please place this patient on my list for repeat ERCP attempt for March 3, for now. Thanks. GM

## 2023-01-25 ENCOUNTER — Encounter (HOSPITAL_COMMUNITY): Payer: Self-pay | Admitting: Gastroenterology

## 2023-01-25 ENCOUNTER — Telehealth: Payer: Self-pay

## 2023-01-25 NOTE — Telephone Encounter (Signed)
 No PA needed via [facility]

## 2023-01-25 NOTE — Telephone Encounter (Signed)
 ----- Message from Toribio Fortune Mayorga sent at 01/22/2023 11:35 PM EST ----- Regarding: RE: Followup ERCP plans Thanks for the help [facility], unfortunately the stricture did not allow passage of the duodenoscope. Will get her scheduled for repeat EGD in 3-4 weeks and dilate until she reaches a decent diameter.  Glenys, can you please schedule an EGD in 3 weeks? Dx: duodenal stricture. Room: any  Thanks,  Toribio Fortune, MD Gastroenterology and Hepatology [facility] Gastroenterology ----- Message ----- From: Wilhelmenia Aloha Raddle., MD Sent: 01/22/2023  12:15 PM EST To: Lupita FORBES Commander, MD; Norleen LOISE Kiang, MD; # Subject: RE: Followup ERCP plans                        JP, Agree with that. I placed Dr. Fortune on here (her primary GI) to see if he could go ahead and get her dilated. Hopefully sooner rather than later. Just with our schedules, can be difficult to get something so many weeks out if we don't get on the books.  DCM, Let us  know know what you can do. In interim, will get on the books with me or one of us , but if you can get that stricture dilated further it will be significantly helpful for us  to not have another aborted ERCP. GM ----- Message ----- From: Kiang Norleen LOISE, MD Sent: 01/22/2023  11:43 AM EST To: Lupita FORBES Commander, MD; Odetta LITTIE Curly, RN; # Subject: RE: Followup ERCP plans                        Gabe, You might consider having a non-ERCP doc(s) repeat endoscopy at some point to assure ulcer healing and adequate dilation of the stricture.  She may need repeat dilation sessions, which they can perform.  At a point when the ulcer is healed and an ERCP scope could be passed effectively and safely, we could check our schedules regarding ERCP with ductal clearance of stone disease.  In the interim, you should place her on your future schedule just to save a spot. Thanks, JP ----- Message ----- From: Wilhelmenia Aloha Raddle., MD Sent: 01/22/2023  11:17  AM EST To: Lupita FORBES Commander, MD; Norleen LOISE Kiang, MD; # Subject: Followup ERCP plans                            DCM, Attempted to do your patient's procedure today however her duodenal stricture at the apex was quite tight (6 mm) I only got her up to 12 mm with her active ulcer in place as well. Unfortunately we cannot pass a ERCP scope until its at least 14 to 15 mm. I have adjusted her PPI to 40 mg twice daily and adjusted her Carafate  to tablet version as this may be more helpful in healing the ulcer in the duodenum rather than liquid. Unfortunately she still has choledocholithiasis. I need you to see if you are willing or one of your partners to help with dilating her further probably in 3 weeks so that we do not have the same issue moving forward. Can you help with this? GM  JP and CG and RG, This is a patient that was referred by Dr. Fortune and I attempted to do ERCP today, but due to a duodenal stricture and duodenal ulcer I could not traverse into D2. We are going to try to heal up her ulcer, but  she needs a repeat attempt at ERCP. With my family situation, I am not can have availability likely until end of February or March. Do any of you have availability in the next 4 to 6 weeks, after we get her further dilated in regards to her duodenal stricture? I will be happy to put her on my schedule but right now it is going to be most likely March. Please reply as able.  GM  Debbera Wolken, Please place this patient on my list for repeat ERCP attempt for March 3, for now. Thanks. GM

## 2023-01-26 ENCOUNTER — Encounter: Payer: Self-pay | Admitting: Gastroenterology

## 2023-01-29 ENCOUNTER — Other Ambulatory Visit: Payer: Self-pay

## 2023-01-29 DIAGNOSIS — R9389 Abnormal findings on diagnostic imaging of other specified body structures: Secondary | ICD-10-CM

## 2023-01-29 DIAGNOSIS — K838 Other specified diseases of biliary tract: Secondary | ICD-10-CM

## 2023-01-29 DIAGNOSIS — K861 Other chronic pancreatitis: Secondary | ICD-10-CM

## 2023-01-29 DIAGNOSIS — G8929 Other chronic pain: Secondary | ICD-10-CM

## 2023-01-29 DIAGNOSIS — R112 Nausea with vomiting, unspecified: Secondary | ICD-10-CM

## 2023-01-29 DIAGNOSIS — K315 Obstruction of duodenum: Secondary | ICD-10-CM

## 2023-01-29 NOTE — Telephone Encounter (Signed)
 ERCP has been set up for 03/26/23 at 12 pm  Left message on machine to call back

## 2023-01-29 NOTE — Progress Notes (Signed)
 I apologize. I have sent a message to endo to change the booking.

## 2023-01-29 NOTE — Telephone Encounter (Signed)
 Per VO from Dr Meridee Score the pt has been moved to 03/29/23 at 8 am at [facility]   Left message on machine to call back

## 2023-01-30 NOTE — Telephone Encounter (Signed)
 Left message on machine to call back

## 2023-01-31 NOTE — Telephone Encounter (Signed)
 I have left several messages on the pt voice mail with no response.  All numbers called available in the chart. I have mailed all the information to the pt home address.  I also sent all to My Chart; although she does not view her messages.

## 2023-01-31 NOTE — Telephone Encounter (Signed)
 Left message on machine to call back

## 2023-01-31 NOTE — Telephone Encounter (Signed)
 The pt returned call and has been advised of the appt for ERCP and all instructions given.  The pt has been advised of the information and verbalized understanding.

## 2023-02-07 ENCOUNTER — Encounter (HOSPITAL_COMMUNITY)
Admission: RE | Admit: 2023-02-07 | Discharge: 2023-02-07 | Disposition: A | Discharge status: Home / Self Care | Payer: Medicare Other | Source: Ambulatory Visit | Attending: Gastroenterology | Admitting: Gastroenterology

## 2023-02-07 ENCOUNTER — Other Ambulatory Visit (HOSPITAL_COMMUNITY)
Admission: RE | Admit: 2023-02-07 | Discharge: 2023-02-07 | Disposition: A | Discharge status: Home / Self Care | Payer: Medicare Other | Source: Ambulatory Visit | Attending: Gastroenterology | Admitting: Gastroenterology

## 2023-02-07 DIAGNOSIS — Z01812 Encounter for preprocedural laboratory examination: Secondary | ICD-10-CM | POA: Diagnosis not present

## 2023-02-07 DIAGNOSIS — K315 Obstruction of duodenum: Secondary | ICD-10-CM | POA: Diagnosis not present

## 2023-02-07 HISTORY — DX: Unspecified osteoarthritis, unspecified site: M19.90

## 2023-02-07 LAB — BASIC METABOLIC PANEL
Anion gap: 9 (ref 5–15)
BUN: 27 mg/dL — ABNORMAL HIGH (ref 8–23)
CO2: 23 mmol/L (ref 22–32)
Calcium: 9.2 mg/dL (ref 8.9–10.3)
Chloride: 110 mmol/L (ref 98–111)
Creatinine, Ser: 1.27 mg/dL — ABNORMAL HIGH (ref 0.44–1.00)
GFR, Estimated: 45 mL/min — ABNORMAL LOW (ref >60)
Glucose, Bld: 95 mg/dL (ref 70–99)
Potassium: 4 mmol/L (ref 3.5–5.1)
Sodium: 142 mmol/L (ref 135–145)

## 2023-02-08 NOTE — Pre-Procedure Instructions (Signed)
Attempted pre-op phonecall. Left VM for her to call us back. 

## 2023-02-12 ENCOUNTER — Encounter (HOSPITAL_COMMUNITY): Payer: Self-pay

## 2023-02-13 ENCOUNTER — Telehealth (INDEPENDENT_AMBULATORY_CARE_PROVIDER_SITE_OTHER): Payer: Self-pay | Admitting: Gastroenterology

## 2023-02-13 ENCOUNTER — Encounter (HOSPITAL_COMMUNITY)
Admission: RE | Disposition: A | Discharge status: Home / Self Care | Payer: Self-pay | Source: Home / Self Care | Attending: Gastroenterology

## 2023-02-13 ENCOUNTER — Ambulatory Visit (HOSPITAL_COMMUNITY): Payer: Medicare Other | Admitting: Anesthesiology

## 2023-02-13 ENCOUNTER — Ambulatory Visit (HOSPITAL_COMMUNITY)
Admission: RE | Admit: 2023-02-13 | Discharge: 2023-02-13 | Disposition: A | Discharge status: Home / Self Care | Payer: Medicare Other | Attending: Gastroenterology | Admitting: Gastroenterology

## 2023-02-13 ENCOUNTER — Encounter (HOSPITAL_COMMUNITY): Payer: Self-pay | Admitting: Gastroenterology

## 2023-02-13 DIAGNOSIS — K222 Esophageal obstruction: Secondary | ICD-10-CM

## 2023-02-13 DIAGNOSIS — K449 Diaphragmatic hernia without obstruction or gangrene: Secondary | ICD-10-CM | POA: Diagnosis not present

## 2023-02-13 DIAGNOSIS — K3189 Other diseases of stomach and duodenum: Secondary | ICD-10-CM | POA: Insufficient documentation

## 2023-02-13 DIAGNOSIS — K219 Gastro-esophageal reflux disease without esophagitis: Secondary | ICD-10-CM | POA: Insufficient documentation

## 2023-02-13 DIAGNOSIS — I10 Essential (primary) hypertension: Secondary | ICD-10-CM | POA: Diagnosis not present

## 2023-02-13 DIAGNOSIS — K315 Obstruction of duodenum: Secondary | ICD-10-CM

## 2023-02-13 DIAGNOSIS — K319 Disease of stomach and duodenum, unspecified: Secondary | ICD-10-CM | POA: Diagnosis not present

## 2023-02-13 HISTORY — PX: ESOPHAGOGASTRODUODENOSCOPY (EGD) WITH PROPOFOL: SHX5813

## 2023-02-13 HISTORY — PX: BALLOON DILATION: SHX5330

## 2023-02-13 SURGERY — ESOPHAGOGASTRODUODENOSCOPY (EGD) WITH PROPOFOL
Anesthesia: General

## 2023-02-13 MED ORDER — PROPOFOL 10 MG/ML IV BOLUS
INTRAVENOUS | Status: DC | PRN
  Administered 2023-02-13 (×5): 25 mg via INTRAVENOUS
  Administered 2023-02-13: 30 mg via INTRAVENOUS
  Administered 2023-02-13: 25 mg via INTRAVENOUS
  Administered 2023-02-13: 70 mg via INTRAVENOUS
  Administered 2023-02-13 (×2): 25 mg via INTRAVENOUS

## 2023-02-13 MED ORDER — LACTATED RINGERS IV SOLN: INTRAVENOUS | Status: DC

## 2023-02-13 MED ORDER — LIDOCAINE HCL (CARDIAC) PF 100 MG/5ML IV SOSY
PREFILLED_SYRINGE | INTRAVENOUS | Status: DC | PRN
  Administered 2023-02-13: 80 mg via INTRATRACHEAL

## 2023-02-13 NOTE — Discharge Instructions (Signed)
You are being discharged to home.  Resume your previous diet.  Take Prilosec (omeprazole) 40 mg by mouth twice a day.  Your physician has recommended a repeat upper endoscopy in three weeks for retreatment.  Repeat upper endoscopy in 3 weeks for retreatment.  No high dose aspirin (BC/Goody powders), ibuprofen, naproxen, meloxicam or other non-steroidal anti-inflammatory drugs.

## 2023-02-13 NOTE — H&P (View-Only) (Signed)
Katie Chan is an 73 y.o. female.   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  Patient underwent recent endoscopic retrograde cholangiopancreatography.  Unfortunately, the scope could not be passed due to presence of duodenal stenosis.  This was dilated but was recommended to have serial dilation until significant scope size could be reached to proceed with ERCP for choledocholithiasis.   Patient states that her abdominal pain is better at the moment although she is still feeling discomfort in the right upper quadrant. The patient denies having any nausea, vomiting, fever, chills, hematochezia, melena, hematemesis, abdominal distention, diarrhea, jaundice, pruritus or weight loss.   Past Medical History:  Diagnosis Date   Arthritis    Carotid artery disease (HCC)    Essential hypertension    Fracture of radius, distal, right, closed 05/04/2015   GERD (gastroesophageal reflux disease)     Past Surgical History:  Procedure Laterality Date   BALLOON DILATION  11/16/2020   Procedure: BALLOON DILATION;  Surgeon: Dolores Frame, MD;  [facility];  Service: Gastroenterology;;   BIOPSY  01/22/2023   Procedure: BIOPSY;  Surgeon: Lemar Lofty., MD;  Location: [facility];  Service: Gastroenterology;;   ERCP N/A 01/22/2023   Procedure: ENDOSCOPIC RETROGRADE CHOLANGIOPANCREATOGRAPHY (ERCP);  Surgeon: Lemar Lofty., MD;  Location: [facility];  Service: Gastroenterology;  Laterality: N/A;  cancelled due to pylori ulcer   ESOPHAGOGASTRODUODENOSCOPY N/A 01/22/2023   Procedure: ESOPHAGOGASTRODUODENOSCOPY (EGD);  Surgeon: Lemar Lofty., MD;  Location: [facility];  Service: Gastroenterology;  Laterality: N/A;   ESOPHAGOGASTRODUODENOSCOPY (EGD) WITH PROPOFOL N/A  11/16/2020   Procedure: ESOPHAGOGASTRODUODENOSCOPY (EGD) WITH PROPOFOL;  Surgeon: Dolores Frame, MD;  [facility]: [facility];  Service: Gastroenterology;  Laterality: N/A;  7:30   OPEN REDUCTION INTERNAL FIXATION (ORIF) DISTAL RADIAL FRACTURE Right 05/04/2015   Procedure: OPEN REDUCTION INTERNAL FIXATION (ORIF) RIGHT DISTAL RADIUS FRACTURE;  Surgeon: Teryl Lucy, MD;  Location: [facility];  Service: Orthopedics;  Laterality: Right;   TONSILLECTOMY AND ADENOIDECTOMY      Family History  Problem Relation Age of Onset   Heart disease Mother        stents placed in heart, PPM   Diabetes Mother    Heart attack Mother    Heart disease Father    Heart attack Father    COPD Father    Diabetes Sister    Cancer Maternal Grandfather    Heart attack Paternal Grandfather 37       massive heart attack   Colon cancer Neg Hx    Colon polyps Neg Hx    Social History:  reports that she has never smoked. She has never used smokeless tobacco. She reports that she does not drink alcohol and does not use drugs.  Allergies: No Known Allergies  Medications Prior to Admission  Medication Sig Dispense Refill   amLODipine (NORVASC) 10 MG tablet Take 1 tablet (10 mg total) by mouth daily. 90 tablet 1   Blood Pressure Monitoring (OMRON 3 SERIES BP MONITOR) DEVI Use as directed 1 each 1   dicyclomine (BENTYL) 10 MG capsule Take 10 mg by mouth in the morning and at bedtime.     gabapentin (NEURONTIN) 300 MG capsule Take 300 mg by mouth daily.     hydrochlorothiazide (HYDRODIURIL) 25 MG tablet Take 25 mg by mouth every morning.  lisinopril (ZESTRIL) 40 MG tablet Take 40 mg by mouth daily.     meloxicam (MOBIC) 7.5 MG tablet Take 7.5 mg by mouth daily.     omeprazole (PRILOSEC) 40 MG capsule Take 1 capsule (40 mg total) by mouth 2 (two) times daily before a meal. 60 capsule 12   sucralfate (CARAFATE) 1 g tablet Take 1 tablet (1 g total) by mouth 4 (four) times daily. 4 times daily  for 2 months then twice daily. 120 tablet 6   ondansetron (ZOFRAN-ODT) 4 MG disintegrating tablet SMARTSIG:Sublingual     OVER THE COUNTER MEDICATION Vit b 12 one daily      No results found for this or any previous visit (from the past 48 hours). No results found.  Review of Systems  All other systems reviewed and are negative.   Blood pressure (!) 182/67, pulse (!) 47, temperature 98.2 F (36.8 C), temperature source Oral, resp. rate 14, height 5\' 2"  (1.575 m), weight 63.5 kg, SpO2 100%. Physical Exam  .GENERAL: The patient is AO x3, in no acute distress. HEENT: Head is normocephalic and atraumatic. EOMI are intact. Mouth is well hydrated and without lesions. NECK: Supple. No masses LUNGS: Clear to auscultation. No presence of rhonchi/wheezing/rales. Adequate chest expansion HEART: RRR, normal s1 and s2. ABDOMEN: Soft, nontender, no guarding, no peritoneal signs, and nondistended. BS +. No masses. EXTREMITIES: Without any cyanosis, clubbing, rash, lesions or edema. NEUROLOGIC: AOx3, no focal motor deficit. 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  We will proceed with EGD.  Dolores Frame, MD 02/13/2023, 1:15 PM

## 2023-02-13 NOTE — Anesthesia Preprocedure Evaluation (Signed)
Anesthesia Evaluation  Patient identified by MRN, date of birth, ID band Patient awake    Reviewed: Allergy & Precautions, H&P , NPO status , Patient's Chart, lab work & pertinent test results, reviewed documented beta blocker date and time   Airway Mallampati: II  TM Distance: >3 FB Neck ROM: full    Dental no notable dental hx.    Pulmonary neg pulmonary ROS   Pulmonary exam normal breath sounds clear to auscultation       Cardiovascular Exercise Tolerance: Good hypertension, negative cardio ROS  Rhythm:regular Rate:Normal     Neuro/Psych negative neurological ROS  negative psych ROS   GI/Hepatic negative GI ROS, Neg liver ROS, PUD,GERD  ,,  Endo/Other  negative endocrine ROS    Renal/GU negative Renal ROS  negative genitourinary   Musculoskeletal   Abdominal   Peds  Hematology negative hematology ROS (+)   Anesthesia Other Findings   Reproductive/Obstetrics negative OB ROS                             Anesthesia Physical Anesthesia Plan  ASA: 3  Anesthesia Plan: General   Post-op Pain Management:    Induction:   PONV Risk Score and Plan: Propofol infusion  Airway Management Planned:   Additional Equipment:   Intra-op Plan:   Post-operative Plan:   Informed Consent: I have reviewed the patients History and Physical, chart, labs and discussed the procedure including the risks, benefits and alternatives for the proposed anesthesia with the patient or authorized representative who has indicated his/her understanding and acceptance.     Dental Advisory Given  Plan Discussed with: CRNA  Anesthesia Plan Comments:        Anesthesia Quick Evaluation

## 2023-02-13 NOTE — H&P (Signed)
Katie Chan is an 73 y.o. female.   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  Patient underwent recent endoscopic retrograde cholangiopancreatography.  Unfortunately, the scope could not be passed due to presence of duodenal stenosis.  This was dilated but was recommended to have serial dilation until significant scope size could be reached to proceed with ERCP for choledocholithiasis.   Patient states that her abdominal pain is better at the moment although she is still feeling discomfort in the right upper quadrant. The patient denies having any nausea, vomiting, fever, chills, hematochezia, melena, hematemesis, abdominal distention, diarrhea, jaundice, pruritus or weight loss.   Past Medical History:  Diagnosis Date   Arthritis    Carotid artery disease (HCC)    Essential hypertension    Fracture of radius, distal, right, closed 05/04/2015   GERD (gastroesophageal reflux disease)     Past Surgical History:  Procedure Laterality Date   BALLOON DILATION  11/16/2020   Procedure: BALLOON DILATION;  Surgeon: Dolores Frame, MD;  [facility]: [facility];  Service: Gastroenterology;;   BIOPSY  01/22/2023   Procedure: BIOPSY;  Surgeon: Lemar Lofty., MD;  Location: [facility];  Service: Gastroenterology;;   ERCP N/A 01/22/2023   Procedure: ENDOSCOPIC RETROGRADE CHOLANGIOPANCREATOGRAPHY (ERCP);  Surgeon: Lemar Lofty., MD;  Location: [facility];  Service: Gastroenterology;  Laterality: N/A;  cancelled due to pylori ulcer   ESOPHAGOGASTRODUODENOSCOPY N/A 01/22/2023   Procedure: ESOPHAGOGASTRODUODENOSCOPY (EGD);  Surgeon: Lemar Lofty., MD;  Location: [facility];  Service: Gastroenterology;  Laterality: N/A;   ESOPHAGOGASTRODUODENOSCOPY (EGD) WITH PROPOFOL N/A  11/16/2020   Procedure: ESOPHAGOGASTRODUODENOSCOPY (EGD) WITH PROPOFOL;  Surgeon: Dolores Frame, MD;  [facility];  Service: Gastroenterology;  Laterality: N/A;  7:30   OPEN REDUCTION INTERNAL FIXATION (ORIF) DISTAL RADIAL FRACTURE Right 05/04/2015   Procedure: OPEN REDUCTION INTERNAL FIXATION (ORIF) RIGHT DISTAL RADIUS FRACTURE;  Surgeon: Teryl Lucy, MD;  Location: [facility];  Service: Orthopedics;  Laterality: Right;   TONSILLECTOMY AND ADENOIDECTOMY      Family History  Problem Relation Age of Onset   Heart disease Mother        stents placed in heart, PPM   Diabetes Mother    Heart attack Mother    Heart disease Father    Heart attack Father    COPD Father    Diabetes Sister    Cancer Maternal Grandfather    Heart attack Paternal Grandfather 61       massive heart attack   Colon cancer Neg Hx    Colon polyps Neg Hx    Social History:  reports that she has never smoked. She has never used smokeless tobacco. She reports that she does not drink alcohol and does not use drugs.  Allergies: No Known Allergies  Medications Prior to Admission  Medication Sig Dispense Refill   amLODipine (NORVASC) 10 MG tablet Take 1 tablet (10 mg total) by mouth daily. 90 tablet 1   Blood Pressure Monitoring (OMRON 3 SERIES BP MONITOR) DEVI Use as directed 1 each 1   dicyclomine (BENTYL) 10 MG capsule Take 10 mg by mouth in the morning and at bedtime.     gabapentin (NEURONTIN) 300 MG capsule Take 300 mg by mouth daily.     hydrochlorothiazide (HYDRODIURIL) 25 MG tablet Take 25 mg by mouth every morning.  lisinopril (ZESTRIL) 40 MG tablet Take 40 mg by mouth daily.     meloxicam (MOBIC) 7.5 MG tablet Take 7.5 mg by mouth daily.     omeprazole (PRILOSEC) 40 MG capsule Take 1 capsule (40 mg total) by mouth 2 (two) times daily before a meal. 60 capsule 12   sucralfate (CARAFATE) 1 g tablet Take 1 tablet (1 g total) by mouth 4 (four) times daily. 4 times daily  for 2 months then twice daily. 120 tablet 6   ondansetron (ZOFRAN-ODT) 4 MG disintegrating tablet SMARTSIG:Sublingual     OVER THE COUNTER MEDICATION Vit b 12 one daily      No results found for this or any previous visit (from the past 48 hours). No results found.  Review of Systems  All other systems reviewed and are negative.   Blood pressure (!) 182/67, pulse (!) 47, temperature 98.2 F (36.8 C), temperature source Oral, resp. rate 14, height 5\' 2"  (1.575 m), weight 63.5 kg, SpO2 100%. Physical Exam  .GENERAL: The patient is AO x3, in no acute distress. HEENT: Head is normocephalic and atraumatic. EOMI are intact. Mouth is well hydrated and without lesions. NECK: Supple. No masses LUNGS: Clear to auscultation. No presence of rhonchi/wheezing/rales. Adequate chest expansion HEART: RRR, normal s1 and s2. ABDOMEN: Soft, nontender, no guarding, no peritoneal signs, and nondistended. BS +. No masses. EXTREMITIES: Without any cyanosis, clubbing, rash, lesions or edema. NEUROLOGIC: AOx3, no focal motor deficit. 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  We will proceed with EGD.  Dolores Frame, MD 02/13/2023, 1:15 PM

## 2023-02-13 NOTE — Transfer of Care (Signed)
Immediate Anesthesia Transfer of Care Note  Patient: Katie Chan  Procedure(s) Performed: ESOPHAGOGASTRODUODENOSCOPY (EGD) WITH PROPOFOL BALLOON DILATION  Patient Location: PACU  Anesthesia Type:General  Level of Consciousness: awake, alert , and oriented  Airway & Oxygen Therapy: Patient Spontanous Breathing  Post-op Assessment: Report given to RN and Post -op Vital signs reviewed and stable  Post vital signs: Reviewed and stable  Last Vitals:  Vitals Value Taken Time  BP 130/47 02/13/23 1343  Temp 36.7 C 02/13/23 1343  Pulse 54 02/13/23 1343  Resp 22 02/13/23 1343  SpO2 99 % 02/13/23 1343    Last Pain:  Vitals:   02/13/23 1343  TempSrc: Axillary  PainSc: Asleep         Complications: No notable events documented.

## 2023-02-13 NOTE — Telephone Encounter (Signed)
Dolores Frame, MD  Marlowe Shores, LPN Hi Kenney Houseman,  Can you please schedule a EGD in 3 weeks? Dx: duodenal stricture. Room: any  Thanks,  Katrinka Blazing, MD Gastroenterology and Hepatology [facility] Gastroenterology

## 2023-02-13 NOTE — Op Note (Signed)
[facility] Patient Name: Katie Chan Procedure Date: 02/13/2023 12:26 PM MRN: [id number] Date of Birth: Apr 24, 1950 Attending MD: Katrinka Blazing , , [id number] CSN: [id number] Age: 73 Admit Type: Outpatient Procedure:                Upper GI endoscopy Indications:              For therapy of duodenal stenosis Providers:                Katrinka Blazing, Crystal Page, Lennice Sites                            Technician, Technician Referring MD:              Medicines:                Monitored Anesthesia Care Complications:            No immediate complications. Estimated Blood Loss:     Estimated blood loss was minimal. Procedure:                Pre-Anesthesia Assessment:                           - Prior to the procedure, a History and Physical                            was performed, and patient medications, allergies                            and sensitivities were reviewed. The patient's                            tolerance of previous anesthesia was reviewed.                           - The risks and benefits of the procedure and the                            sedation options and risks were discussed with the                            patient. All questions were answered and informed                            consent was obtained.                           After obtaining informed consent, the endoscope was                            passed under direct vision. Throughout the                            procedure, the patient's blood pressure, pulse, and                            oxygen saturations were monitored  continuously. The                            GIF-H190 ([id number]) scope was introduced through the                            mouth, and advanced to the duodenal bulb. The upper                            GI endoscopy was accomplished without difficulty.                            The patient tolerated the procedure well. The                             [id number]) scope was introduced through                            the mouth, and advanced to the second part of                            duodenum. Scope In: 1:23:38 PM Scope Out: 1:40:18 PM Total Procedure Duration: 0 hours 16 minutes 40 seconds  Findings:      A 2 cm hiatal hernia was present.      A few localized small erosions with no stigmata of recent bleeding were       found in the gastric antrum.      An acquired benign-appearing, intrinsic severe stenosis was found in the       duodenal bulb and was traversed after dilation. I switched the scope to       an ultra slim colonoscope but this could not be advance. A TTS dilator       was passed through the scope. Dilation with a 11-03-10 mm balloon       dilator was performed to 12 mm. The dilation site was examined and       showed mild mucosal disruption and moderate improvement in luminal       narrowing. The rest of the examined duodenum was normal Impression:               - 2 cm hiatal hernia.                           - Erosive gastropathy with no stigmata of recent                            bleeding.                           - Acquired duodenal stenosis. Dilated.                           - No specimens collected. Moderate Sedation:      Per Anesthesia Care Recommendation:           - Discharge patient to home (ambulatory).                           -  Resume previous diet.                           - Use Prilosec (omeprazole) 40 mg PO BID.                           - Repeat upper endoscopy in 3 weeks for retreatment.                           - No high dose aspirin, ibuprofen, naproxen, or                            other non-steroidal anti-inflammatory drugs. Procedure Code(s):        --- Professional ---                           [phone], Esophagogastroduodenoscopy, flexible,                            transoral; with dilation of gastric/duodenal                            stricture(s) (eg, balloon,  bougie) Diagnosis Code(s):        --- Professional ---                           K44.9, Diaphragmatic hernia without obstruction or                            gangrene                           K31.89, Other diseases of stomach and duodenum                           K31.5, Obstruction of duodenum CPT copyright 2022 [employer]. All rights reserved. The codes documented in this report are preliminary and upon coder review may  be revised to meet current compliance requirements. Katrinka Blazing, MD Katrinka Blazing,  02/13/2023 1:54:12 PM This report has been signed electronically. Number of Addenda: 0

## 2023-02-15 ENCOUNTER — Encounter (HOSPITAL_COMMUNITY): Payer: Self-pay | Admitting: Gastroenterology

## 2023-02-16 NOTE — Anesthesia Postprocedure Evaluation (Signed)
Anesthesia Post Note  Patient: JONNAE FONSECA  Procedure(s) Performed: ESOPHAGOGASTRODUODENOSCOPY (EGD) WITH PROPOFOL BALLOON DILATION  Patient location during evaluation: Phase II Anesthesia Type: General Level of consciousness: awake Pain management: pain level controlled Vital Signs Assessment: post-procedure vital signs reviewed and stable Respiratory status: spontaneous breathing and respiratory function stable Cardiovascular status: blood pressure returned to baseline and stable Postop Assessment: no headache and no apparent nausea or vomiting Anesthetic complications: no Comments: Late entry   No notable events documented.   Last Vitals:  Vitals:   02/13/23 1343 02/13/23 1348  BP: (!) 130/47 (!) 135/46  Pulse: (!) 54   Resp: (!) 22 15  Temp: 36.7 C   SpO2: 99% 100%    Last Pain:  Vitals:   02/13/23 1348  TempSrc:   PainSc: 0-No pain                 Windell Norfolk

## 2023-02-20 DIAGNOSIS — J069 Acute upper respiratory infection, unspecified: Secondary | ICD-10-CM | POA: Diagnosis not present

## 2023-02-20 NOTE — Telephone Encounter (Signed)
Pt contacted and scheduled. Instructions will be mailed after pre op appt has been given (pt requested a Tuesday).

## 2023-03-02 ENCOUNTER — Encounter (HOSPITAL_COMMUNITY)
Admission: RE | Admit: 2023-03-02 | Discharge: 2023-03-02 | Disposition: A | Discharge status: Home / Self Care | Payer: Medicare Other | Source: Ambulatory Visit | Attending: Gastroenterology | Admitting: Gastroenterology

## 2023-03-06 ENCOUNTER — Telehealth (INDEPENDENT_AMBULATORY_CARE_PROVIDER_SITE_OTHER): Payer: Self-pay | Admitting: Gastroenterology

## 2023-03-06 ENCOUNTER — Ambulatory Visit (HOSPITAL_COMMUNITY): Payer: Medicare Other | Admitting: Anesthesiology

## 2023-03-06 ENCOUNTER — Encounter (HOSPITAL_COMMUNITY)
Admission: RE | Disposition: A | Discharge status: Home / Self Care | Payer: Self-pay | Source: Home / Self Care | Attending: Gastroenterology

## 2023-03-06 ENCOUNTER — Ambulatory Visit (HOSPITAL_COMMUNITY)
Admission: RE | Admit: 2023-03-06 | Discharge: 2023-03-06 | Disposition: A | Discharge status: Home / Self Care | Payer: Medicare Other | Attending: Gastroenterology | Admitting: Gastroenterology

## 2023-03-06 ENCOUNTER — Ambulatory Visit (HOSPITAL_BASED_OUTPATIENT_CLINIC_OR_DEPARTMENT_OTHER): Payer: Medicare Other | Admitting: Anesthesiology

## 2023-03-06 DIAGNOSIS — K315 Obstruction of duodenum: Secondary | ICD-10-CM | POA: Diagnosis not present

## 2023-03-06 DIAGNOSIS — I10 Essential (primary) hypertension: Secondary | ICD-10-CM | POA: Insufficient documentation

## 2023-03-06 DIAGNOSIS — Z7982 Long term (current) use of aspirin: Secondary | ICD-10-CM | POA: Diagnosis not present

## 2023-03-06 DIAGNOSIS — K219 Gastro-esophageal reflux disease without esophagitis: Secondary | ICD-10-CM | POA: Insufficient documentation

## 2023-03-06 HISTORY — PX: ESOPHAGOGASTRODUODENOSCOPY (EGD) WITH PROPOFOL: SHX5813

## 2023-03-06 SURGERY — ESOPHAGOGASTRODUODENOSCOPY (EGD) WITH PROPOFOL
Anesthesia: General

## 2023-03-06 MED ORDER — PROPOFOL 10 MG/ML IV BOLUS
INTRAVENOUS | Status: DC | PRN
  Administered 2023-03-06: 40 mg via INTRAVENOUS
  Administered 2023-03-06: 100 mg via INTRAVENOUS

## 2023-03-06 MED ORDER — LACTATED RINGERS IV SOLN: INTRAVENOUS | Status: DC | PRN

## 2023-03-06 MED ORDER — METOCLOPRAMIDE HCL 5 MG/ML IJ SOLN: 10.0000 mg | Freq: Once | INTRAMUSCULAR | Status: DC | PRN

## 2023-03-06 MED ORDER — PROPOFOL 500 MG/50ML IV EMUL
INTRAVENOUS | Status: DC | PRN
  Administered 2023-03-06: 150 ug/kg/min via INTRAVENOUS

## 2023-03-06 MED ORDER — LIDOCAINE HCL (PF) 2 % IJ SOLN
INTRAMUSCULAR | Status: DC | PRN
  Administered 2023-03-06: 100 mg via INTRADERMAL

## 2023-03-06 MED ORDER — FENTANYL CITRATE (PF) 100 MCG/2ML IJ SOLN: 25.0000 ug | INTRAMUSCULAR | Status: DC | PRN

## 2023-03-06 MED ORDER — MEPERIDINE HCL 50 MG/ML IJ SOLN
6.2500 mg | INTRAMUSCULAR | Status: DC | PRN
Start: 2023-03-06 — End: 2023-03-06

## 2023-03-06 MED ORDER — LACTATED RINGERS IV SOLN: INTRAVENOUS | Status: DC

## 2023-03-06 NOTE — Discharge Instructions (Signed)
You are being discharged to home.  Resume your previous diet.  Continue your present medications.  Do not take any aspirin, ibuprofen (including Advil, Motrin or Nuprin), naproxen (including Aleve), or any other non-steroidal anti-inflammatory drugs.  Your physician has recommended a repeat upper endoscopy in two weeks for retreatment.

## 2023-03-06 NOTE — Anesthesia Preprocedure Evaluation (Addendum)
Anesthesia Evaluation  Patient identified by MRN, date of birth, ID band Patient awake    Reviewed: Allergy & Precautions, H&P , NPO status , Patient's Chart, lab work & pertinent test results, reviewed documented beta blocker date and time   Airway Mallampati: II  TM Distance: >3 FB Neck ROM: full  Mouth opening: Limited Mouth Opening  Dental no notable dental hx. (+) Partial Lower   Pulmonary neg pulmonary ROS   Pulmonary exam normal breath sounds clear to auscultation       Cardiovascular Exercise Tolerance: Good hypertension, Normal cardiovascular exam Rhythm:regular Rate:Normal     Neuro/Psych negative neurological ROS  negative psych ROS   GI/Hepatic Neg liver ROS, PUD,GERD  Medicated,,  Endo/Other  negative endocrine ROS    Renal/GU negative Renal ROS  negative genitourinary   Musculoskeletal   Abdominal   Peds  Hematology negative hematology ROS (+)   Anesthesia Other Findings   Reproductive/Obstetrics negative OB ROS                             Anesthesia Physical Anesthesia Plan  ASA: 2  Anesthesia Plan: General   Post-op Pain Management: Minimal or no pain anticipated   Induction: Intravenous  PONV Risk Score and Plan: 1  Airway Management Planned: Simple Face Mask  Additional Equipment:   Intra-op Plan:   Post-operative Plan:   Informed Consent: I have reviewed the patients History and Physical, chart, labs and discussed the procedure including the risks, benefits and alternatives for the proposed anesthesia with the patient or authorized representative who has indicated his/her understanding and acceptance.     Dental Advisory Given  Plan Discussed with: CRNA  Anesthesia Plan Comments:        Anesthesia Quick Evaluation

## 2023-03-06 NOTE — Transfer of Care (Signed)
Immediate Anesthesia Transfer of Care Note  Patient: Katie Chan  Procedure(s) Performed: ESOPHAGOGASTRODUODENOSCOPY (EGD) WITH PROPOFOL Balloon dilation wire-guided  Patient Location: Short Stay  Anesthesia Type:General  Level of Consciousness: drowsy  Airway & Oxygen Therapy: Patient Spontanous Breathing  Post-op Assessment: Report given to RN and Post -op Vital signs reviewed and stable  Post vital signs: Reviewed and stable  Last Vitals:  Vitals Value Taken Time  BP    Temp    Pulse    Resp    SpO2      Last Pain:  Vitals:   03/06/23 0845  TempSrc: Oral  PainSc: 0-No pain      Patients Stated Pain Goal: 5 (03/06/23 0845)  Complications: No notable events documented.

## 2023-03-06 NOTE — Anesthesia Postprocedure Evaluation (Signed)
Anesthesia Post Note  Patient: Katie Chan  Procedure(s) Performed: ESOPHAGOGASTRODUODENOSCOPY (EGD) WITH PROPOFOL Balloon dilation wire-guided  Patient location during evaluation: PACU Anesthesia Type: General Level of consciousness: awake and alert Pain management: pain level controlled Vital Signs Assessment: post-procedure vital signs reviewed and stable Respiratory status: spontaneous breathing, nonlabored ventilation and respiratory function stable Cardiovascular status: blood pressure returned to baseline and stable Postop Assessment: no apparent nausea or vomiting Anesthetic complications: no   No notable events documented.   Last Vitals:  Vitals:   03/06/23 0845 03/06/23 1014  BP: (!) 185/52 (!) 141/55  Pulse: (!) 48 66  Resp:  18  Temp: 36.6 C 36.6 C  SpO2: 99% 99%    Last Pain:  Vitals:   03/06/23 1014  TempSrc:   PainSc: 0-No pain                 Roslynn Amble

## 2023-03-06 NOTE — Anesthesia Procedure Notes (Signed)
Date/Time: 03/06/2023 9:50 AM  Performed by: Julian Reil, CRNAPre-anesthesia Checklist: Patient identified, Emergency Drugs available, Suction available and Patient being monitored Patient Re-evaluated:Patient Re-evaluated prior to induction Oxygen Delivery Method: Nasal cannula Induction Type: IV induction Placement Confirmation: positive ETCO2 Comments: Optiflow High Flow [place]ortches O2 used.

## 2023-03-06 NOTE — Telephone Encounter (Signed)
Per Dr.Castaneda:     Hi Please schedule her for EGD on 2/26 thanks, diagnosis esophageal stricture   Pt scheduled for 2/26 at 1:00pm. Will mail instructions to pt. No pa needed per insurance.

## 2023-03-06 NOTE — Op Note (Signed)
[facility] Patient Name: Katie Chan Procedure Date: 03/06/2023 9:43 AM MRN: [id number] Date of Birth: 02/10/1950 Attending MD: Katrinka Blazing , , [id number] CSN: [id number] Age: 73 Admit Type: Outpatient Procedure:                Upper GI endoscopy Indications:              For therapy of duodenal stenosis Providers:                Katrinka Blazing, Nena Polio, RN, Kristine L. Jessee Avers, Technician Referring MD:              Medicines:                Monitored Anesthesia Care Complications:            No immediate complications. Estimated Blood Loss:     Estimated blood loss: none. Procedure:                Pre-Anesthesia Assessment:                           - Prior to the procedure, a History and Physical                            was performed, and patient medications, allergies                            and sensitivities were reviewed. The patient's                            tolerance of previous anesthesia was reviewed.                           - The risks and benefits of the procedure and the                            sedation options and risks were discussed with the                            patient. All questions were answered and informed                            consent was obtained.                           - ASA Grade Assessment: III - A patient with severe                            systemic disease.                           After obtaining informed consent, the endoscope was                            passed under direct vision. Throughout  the                            procedure, the patient's blood pressure, pulse, and                            oxygen saturations were monitored continuously. The                            GIF-H190 ([id number]) scope was introduced through the                            mouth, and advanced to the second part of duodenum.                            The upper GI endoscopy was accomplished  without                            difficulty. The patient tolerated the procedure                            well. Scope In: 9:55:24 AM Scope Out: 10:10:08 AM Total Procedure Duration: 0 hours 14 minutes 44 seconds  Findings:      The examined esophagus was normal.      The stomach was normal.      An acquired benign-appearing, intrinsic moderate stenosis was found in       the duodenal bulb and was traversed after dilation. A TTS dilator was       passed through the scope. Dilation with a 12-13.5-15 mm balloon dilator       was performed to 15 mm.      The second portion of the duodenum was normal. Impression:               - Normal esophagus.                           - Normal stomach.                           - Acquired duodenal stenosis. Dilated.                           - Normal second portion of the duodenum.                           - No specimens collected. Moderate Sedation:      Per Anesthesia Care Recommendation:           - Discharge patient to home (ambulatory).                           - Resume previous diet.                           - Continue present medications.                           -  No aspirin, ibuprofen, naproxen, or other                            non-steroidal anti-inflammatory drugs.                           - Repeat upper endoscopy in 2 weeks for retreatment. Procedure Code(s):        --- Professional ---                           [phone], Esophagogastroduodenoscopy, flexible,                            transoral; with dilation of gastric/duodenal                            stricture(s) (eg, balloon, bougie) Diagnosis Code(s):        --- Professional ---                           K31.5, Obstruction of duodenum CPT copyright 2022 American Medical Association. All rights reserved. The codes documented in this report are preliminary and upon coder review may  be revised to meet current compliance requirements. Katrinka Blazing, MD Katrinka Blazing,   03/06/2023 10:20:01 AM This report has been signed electronically. Number of Addenda: 0

## 2023-03-06 NOTE — Interval H&P Note (Signed)
History and Physical Interval Note:  03/06/2023 9:18 AM  Patient feeling less early satiety and abdominal discomfort after having meals.     Will undergo EGD with repeat duodenal dilation.  Katie Chan  has presented today for surgery, with the diagnosis of DUODENAL STRICTURE.  The various methods of treatment have been discussed with the patient and family. After consideration of risks, benefits and other options for treatment, the patient has consented to  Procedure(s) with comments: ESOPHAGOGASTRODUODENOSCOPY (EGD) WITH PROPOFOL (N/A) - 1:45PM;ASA 1-2 as a surgical intervention.  The patient's history has been reviewed, patient examined, no change in status, stable for surgery.  I have reviewed the patient's chart and labs.  Questions were answered to the patient's satisfaction.     Katrinka Blazing Mayorga

## 2023-03-07 ENCOUNTER — Encounter (HOSPITAL_COMMUNITY): Payer: Self-pay | Admitting: Gastroenterology

## 2023-03-07 NOTE — Telephone Encounter (Signed)
Pt contacted and verbalized understanding. Pt would like results mailed to her because she can not get in her my chart.

## 2023-03-19 ENCOUNTER — Telehealth: Payer: Self-pay

## 2023-03-20 NOTE — Telephone Encounter (Signed)
 Procedure:endO Procedure date: 03/29/23 Procedure location: wl Arrival Time: 630 AM Spoke with the patient Y/N: Y Any prep concerns? N  Has the patient obtained the prep from the pharmacy ? N Do you have a care partner and transportation: Y Any additional concerns? N

## 2023-03-21 ENCOUNTER — Encounter (HOSPITAL_COMMUNITY): Payer: Self-pay | Admitting: Gastroenterology

## 2023-03-21 ENCOUNTER — Ambulatory Visit (HOSPITAL_COMMUNITY): Payer: Medicare Other | Admitting: Certified Registered"

## 2023-03-21 ENCOUNTER — Ambulatory Visit (HOSPITAL_COMMUNITY)
Admission: RE | Admit: 2023-03-21 | Discharge: 2023-03-21 | Disposition: A | Discharge status: Home / Self Care | Payer: Medicare Other | Attending: Gastroenterology | Admitting: Gastroenterology

## 2023-03-21 ENCOUNTER — Ambulatory Visit (HOSPITAL_BASED_OUTPATIENT_CLINIC_OR_DEPARTMENT_OTHER): Payer: Medicare Other | Admitting: Certified Registered"

## 2023-03-21 ENCOUNTER — Other Ambulatory Visit: Payer: Self-pay

## 2023-03-21 ENCOUNTER — Encounter (HOSPITAL_COMMUNITY)
Admission: RE | Disposition: A | Discharge status: Home / Self Care | Payer: Self-pay | Source: Home / Self Care | Attending: Gastroenterology

## 2023-03-21 DIAGNOSIS — I10 Essential (primary) hypertension: Secondary | ICD-10-CM | POA: Insufficient documentation

## 2023-03-21 DIAGNOSIS — M1990 Unspecified osteoarthritis, unspecified site: Secondary | ICD-10-CM

## 2023-03-21 DIAGNOSIS — K449 Diaphragmatic hernia without obstruction or gangrene: Secondary | ICD-10-CM | POA: Insufficient documentation

## 2023-03-21 DIAGNOSIS — I779 Disorder of arteries and arterioles, unspecified: Secondary | ICD-10-CM | POA: Insufficient documentation

## 2023-03-21 DIAGNOSIS — K315 Obstruction of duodenum: Secondary | ICD-10-CM

## 2023-03-21 DIAGNOSIS — Z7982 Long term (current) use of aspirin: Secondary | ICD-10-CM | POA: Insufficient documentation

## 2023-03-21 DIAGNOSIS — K219 Gastro-esophageal reflux disease without esophagitis: Secondary | ICD-10-CM | POA: Diagnosis not present

## 2023-03-21 DIAGNOSIS — Z8711 Personal history of peptic ulcer disease: Secondary | ICD-10-CM | POA: Diagnosis not present

## 2023-03-21 HISTORY — PX: ESOPHAGOGASTRODUODENOSCOPY (EGD) WITH PROPOFOL: SHX5813

## 2023-03-21 HISTORY — PX: BALLOON DILATION: SHX5330

## 2023-03-21 SURGERY — ESOPHAGOGASTRODUODENOSCOPY (EGD) WITH PROPOFOL
Anesthesia: General

## 2023-03-21 MED ORDER — SODIUM CHLORIDE 0.9% FLUSH: 3.0000 mL | INTRAVENOUS | Status: DC | PRN

## 2023-03-21 MED ORDER — PROPOFOL 500 MG/50ML IV EMUL
INTRAVENOUS | Status: DC | PRN
  Administered 2023-03-21: 150 ug/kg/min via INTRAVENOUS

## 2023-03-21 MED ORDER — LIDOCAINE HCL (PF) 2 % IJ SOLN
INTRAMUSCULAR | Status: DC | PRN
  Administered 2023-03-21: 100 mg via INTRADERMAL

## 2023-03-21 MED ORDER — LACTATED RINGERS IV SOLN: INTRAVENOUS | Status: DC | PRN

## 2023-03-21 MED ORDER — PROPOFOL 10 MG/ML IV BOLUS
INTRAVENOUS | Status: DC | PRN
  Administered 2023-03-21: 50 mg via INTRAVENOUS
  Administered 2023-03-21: 100 mg via INTRAVENOUS

## 2023-03-21 MED ORDER — SODIUM CHLORIDE 0.9% FLUSH: 3.0000 mL | Freq: Two times a day (BID) | INTRAVENOUS | Status: DC

## 2023-03-21 NOTE — Anesthesia Procedure Notes (Signed)
 Date/Time: 03/21/2023 12:02 PM  Performed by: Julian Reil, CRNAPre-anesthesia Checklist: Patient identified, Emergency Drugs available, Suction available and Patient being monitored Patient Re-evaluated:Patient Re-evaluated prior to induction Oxygen Delivery Method: Nasal cannula Induction Type: IV induction Placement Confirmation: positive ETCO2

## 2023-03-21 NOTE — Discharge Instructions (Signed)
 You are being discharged to home.  Resume your previous diet.  Continue your present medications.  Proceed with scheduled ERCP in March, as scheduled.

## 2023-03-21 NOTE — Op Note (Signed)
 [facility] Patient Name: Katie Chan Procedure Date: 03/21/2023 11:58 AM MRN: [id number] Date of Birth: 1950/11/13 Attending MD: Katrinka Blazing , , [id number] CSN: [id number] Age: 73 Admit Type: Outpatient Procedure:                Upper GI endoscopy Indications:              For therapy of duodenal stenosis Providers:                Katrinka Blazing, Crystal Page, Lennice Sites                            Technician, Technician Referring MD:             Corliss Parish, MD Medicines:                Monitored Anesthesia Care Complications:            No immediate complications. Estimated Blood Loss:     Estimated blood loss: none. Procedure:                Pre-Anesthesia Assessment:                           - Prior to the procedure, a History and Physical                            was performed, and patient medications, allergies                            and sensitivities were reviewed. The patient's                            tolerance of previous anesthesia was reviewed.                           - The risks and benefits of the procedure and the                            sedation options and risks were discussed with the                            patient. All questions were answered and informed                            consent was obtained.                           - ASA Grade Assessment: II - A patient with mild                            systemic disease.                           After obtaining informed consent, the endoscope was                            passed under direct vision. Throughout  the                            procedure, the patient's blood pressure, pulse, and                            oxygen saturations were monitored continuously. The                            GIF-H190 ([id number]) scope was introduced through the                            mouth, and advanced to the second part of duodenum.                            The upper GI endoscopy  was accomplished without                            difficulty. The patient tolerated the procedure                            well. Scope In: 12:07:16 PM Scope Out: 12:18:58 PM Total Procedure Duration: 0 hours 11 minutes 42 seconds  Findings:      A 2 cm hiatal hernia was present.      The stomach was normal.      An acquired benign-appearing, intrinsic moderate stenosis was found in       the first portion of the duodenum and was traversed. There was       significant angulation of the duodenal sweep. A TTS dilator was passed       through the scope. Dilation with a 15-16.5-18 mm balloon dilator was       performed to 18 mm. An adequate wrent with hem was noticed, but no       perforation was observed. Impression:               - 2 cm hiatal hernia.                           - Normal stomach.                           - Acquired duodenal stenosis. Dilated.                           - No specimens collected. Moderate Sedation:      Per Anesthesia Care Recommendation:           - Discharge patient to home (ambulatory).                           - Resume previous diet.                           - Continue present medications.                           - Proceed with scheduled ERCP in March, as  scheduled. Procedure Code(s):        --- Professional ---                           [phone], Esophagogastroduodenoscopy, flexible,                            transoral; with dilation of gastric/duodenal                            stricture(s) (eg, balloon, bougie) Diagnosis Code(s):        --- Professional ---                           K44.9, Diaphragmatic hernia without obstruction or                            gangrene                           K31.5, Obstruction of duodenum CPT copyright 2022 American Medical Association. All rights reserved. The codes documented in this report are preliminary and upon coder review may  be revised to meet current compliance  requirements. Katrinka Blazing, MD Katrinka Blazing,  03/21/2023 12:28:05 PM This report has been signed electronically. Number of Addenda: 0

## 2023-03-21 NOTE — H&P (Signed)
 Katie Chan is an 73 y.o. female.   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   Patient reports improvement after most recent EGD.  Not having too much dysphagia or early satiety. The patient denies having any nausea, vomiting, fever, chills, hematochezia, melena, hematemesis, abdominal distention, abdominal pain, diarrhea, jaundice, pruritus or weight loss.  Past Medical History:  Diagnosis Date   Arthritis    Carotid artery disease (HCC)    Essential hypertension    Fracture of radius, distal, right, closed 05/04/2015   GERD (gastroesophageal reflux disease)     Past Surgical History:  Procedure Laterality Date   BALLOON DILATION  11/16/2020   Procedure: BALLOON DILATION;  Surgeon: Dolores Frame, MD;  Location: [facility];  Service: Gastroenterology;;   Marvis Repress DILATION  02/13/2023   Procedure: [place];  Surgeon: Dolores Frame, MD;  Location: [facility];  Service: Gastroenterology;;   BIOPSY  01/22/2023   Procedure: BIOPSY;  Surgeon: Lemar Lofty., MD;  Location: [facility];  Service: Gastroenterology;;   ERCP N/A 01/22/2023   Procedure: ENDOSCOPIC RETROGRADE CHOLANGIOPANCREATOGRAPHY (ERCP);  Surgeon: Lemar Lofty., MD;  Location: [facility];  Service: Gastroenterology;  Laterality: N/A;  cancelled due to pylori ulcer   ESOPHAGOGASTRODUODENOSCOPY N/A 01/22/2023   Procedure: ESOPHAGOGASTRODUODENOSCOPY (EGD);  Surgeon: Lemar Lofty., MD;  Location: [facility];  Service: Gastroenterology;  Laterality: N/A;   ESOPHAGOGASTRODUODENOSCOPY (EGD) WITH PROPOFOL N/A 11/16/2020   Procedure: ESOPHAGOGASTRODUODENOSCOPY (EGD) WITH PROPOFOL;  Surgeon: Dolores Frame, MD;  [facility]: [facility];  Service:  Gastroenterology;  Laterality: N/A;  7:30   ESOPHAGOGASTRODUODENOSCOPY (EGD) WITH PROPOFOL N/A 02/13/2023   Procedure: ESOPHAGOGASTRODUODENOSCOPY (EGD) WITH PROPOFOL;  Surgeon: Dolores Frame, MD;  Location: [facility];  Service: Gastroenterology;  Laterality: N/A;  2:00pm;asa 1-2   ESOPHAGOGASTRODUODENOSCOPY (EGD) WITH PROPOFOL N/A 03/06/2023   Procedure: ESOPHAGOGASTRODUODENOSCOPY (EGD) WITH PROPOFOL;  Surgeon: Dolores Frame, MD;  Location: [facility];  Service: Gastroenterology;  Laterality: N/A;  1:45PM;ASA 1-2   OPEN REDUCTION INTERNAL FIXATION (ORIF) DISTAL RADIAL FRACTURE Right 05/04/2015   Procedure: OPEN REDUCTION INTERNAL FIXATION (ORIF) RIGHT DISTAL RADIUS FRACTURE;  Surgeon: Teryl Lucy, MD;  Location: [facility];  Service: Orthopedics;  Laterality: Right;   TONSILLECTOMY AND ADENOIDECTOMY      Family History  Problem Relation Age of Onset   Heart disease Mother        stents placed in heart, PPM   Diabetes Mother    Heart attack Mother    Heart disease Father    Heart attack Father    COPD Father    Diabetes Sister    Cancer Maternal Grandfather    Heart attack Paternal Grandfather 44       massive heart attack   Colon cancer Neg Hx    Colon polyps Neg Hx    Social History:  reports that she has never smoked. She has never used smokeless tobacco. She reports that she does not drink alcohol and does not use drugs.  Allergies: No Known Allergies  Medications Prior to Admission  Medication Sig Dispense Refill   amLODipine (NORVASC) 10 MG tablet Take 1 tablet (10 mg total) by mouth daily. 90 tablet 1   Blood Pressure Monitoring (OMRON 3 SERIES BP MONITOR) DEVI Use as directed 1 each 1   dicyclomine (BENTYL) 10  MG capsule Take 10 mg by mouth in the morning and at bedtime.     gabapentin (NEURONTIN) 300 MG capsule Take 300 mg by mouth daily.     hydrochlorothiazide (HYDRODIURIL) 25 MG tablet Take 25 mg by mouth every morning.      lisinopril (ZESTRIL) 40 MG tablet Take 40 mg by mouth daily.     omeprazole (PRILOSEC) 40 MG capsule Take 1 capsule (40 mg total) by mouth 2 (two) times daily before a meal. 60 capsule 12   ondansetron (ZOFRAN-ODT) 4 MG disintegrating tablet SMARTSIG:Sublingual     OVER THE COUNTER MEDICATION Vit b 12 one daily     sucralfate (CARAFATE) 1 g tablet Take 1 tablet (1 g total) by mouth 4 (four) times daily. 4 times daily for 2 months then twice daily. 120 tablet 6    No results found for this or any previous visit (from the past 48 hours). No results found.  Review of Systems  All other systems reviewed and are negative.   There were no vitals taken for this visit. Physical Exam  GENERAL: The patient is AO x3, in no acute distress. HEENT: Head is normocephalic and atraumatic. EOMI are intact. Mouth is well hydrated and without lesions. NECK: Supple. No masses LUNGS: Clear to auscultation. No presence of rhonchi/wheezing/rales. Adequate chest expansion HEART: RRR, normal s1 and s2. ABDOMEN: Soft, nontender, no guarding, no peritoneal signs, and nondistended. BS +. No masses. EXTREMITIES: Without any cyanosis, clubbing, rash, lesions or edema. NEUROLOGIC: AOx3, no focal motor deficit. 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  Will proceed with EGD.  Dolores Frame, MD 03/21/2023, 11:43 AM

## 2023-03-21 NOTE — Anesthesia Postprocedure Evaluation (Signed)
 Anesthesia Post Note  Patient: Katie Chan  Procedure(s) Performed: ESOPHAGOGASTRODUODENOSCOPY (EGD) WITH PROPOFOL BALLOON DILATION  Patient location during evaluation: PACU Anesthesia Type: General Level of consciousness: awake and alert Pain management: pain level controlled Vital Signs Assessment: post-procedure vital signs reviewed and stable Respiratory status: spontaneous breathing, nonlabored ventilation, respiratory function stable and patient connected to nasal cannula oxygen Cardiovascular status: blood pressure returned to baseline and stable Postop Assessment: no apparent nausea or vomiting Anesthetic complications: no   There were no known notable events for this encounter.   Last Vitals:  Vitals:   03/21/23 1136 03/21/23 1223  BP: (!) 162/62 (!) 167/72  Pulse: (!) 48 64  Resp: 16 19  Temp: 36.8 C 36.9 C  SpO2: 97% 96%    Last Pain:  Vitals:   03/21/23 1223  TempSrc: Axillary  PainSc: 0-No pain                 Janesha Brissette L Ngoc Daughtridge

## 2023-03-21 NOTE — Transfer of Care (Signed)
 Immediate Anesthesia Transfer of Care Note  Patient: Katie Chan  Procedure(s) Performed: ESOPHAGOGASTRODUODENOSCOPY (EGD) WITH PROPOFOL BALLOON DILATION  Patient Location: Endoscopy Unit  Anesthesia Type:General  Level of Consciousness: drowsy  Airway & Oxygen Therapy: Patient Spontanous Breathing  Post-op Assessment: Report given to RN and Post -op Vital signs reviewed and stable  Post vital signs: Reviewed and stable  Last Vitals:  Vitals Value Taken Time  BP    Temp    Pulse    Resp    SpO2      Last Pain:  Vitals:   03/21/23 1203  TempSrc:   PainSc: 0-No pain      Patients Stated Pain Goal: 5 (03/21/23 1136)  Complications: No notable events documented.

## 2023-03-21 NOTE — Anesthesia Preprocedure Evaluation (Addendum)
 Anesthesia Evaluation  Patient identified by MRN, date of birth, ID band Patient awake    Reviewed: Allergy & Precautions, H&P , NPO status , Patient's Chart, lab work & pertinent test results, reviewed documented beta blocker date and time   Airway Mallampati: II  TM Distance: >3 FB Neck ROM: full    Dental  (+) Dental Advisory Given, Partial Lower   Pulmonary neg pulmonary ROS   Pulmonary exam normal breath sounds clear to auscultation       Cardiovascular Exercise Tolerance: Good hypertension, Normal cardiovascular exam Rhythm:regular Rate:Normal     Neuro/Psych Carotid disease negative neurological ROS  negative psych ROS   GI/Hepatic negative GI ROS, Neg liver ROS, PUD,GERD  ,,  Endo/Other  negative endocrine ROS    Renal/GU negative Renal ROS  negative genitourinary   Musculoskeletal  (+) Arthritis , Osteoarthritis,    Abdominal   Peds  Hematology negative hematology ROS (+)   Anesthesia Other Findings   Reproductive/Obstetrics negative OB ROS                             Anesthesia Physical Anesthesia Plan  ASA: 3  Anesthesia Plan: General   Post-op Pain Management: Minimal or no pain anticipated   Induction: Intravenous  PONV Risk Score and Plan: Propofol infusion  Airway Management Planned: Nasal Cannula and Natural Airway  Additional Equipment: None  Intra-op Plan:   Post-operative Plan:   Informed Consent: I have reviewed the patients History and Physical, chart, labs and discussed the procedure including the risks, benefits and alternatives for the proposed anesthesia with the patient or authorized representative who has indicated his/her understanding and acceptance.     Dental Advisory Given  Plan Discussed with: CRNA  Anesthesia Plan Comments:        Anesthesia Quick Evaluation

## 2023-03-22 ENCOUNTER — Encounter (HOSPITAL_COMMUNITY): Payer: Self-pay | Admitting: Gastroenterology

## 2023-03-28 ENCOUNTER — Telehealth: Payer: Self-pay

## 2023-03-28 NOTE — Telephone Encounter (Signed)
 I was able to speak with the pt and advised her of the appt change.  She has been re instructed and all questions answered.

## 2023-03-28 NOTE — Telephone Encounter (Signed)
 This pt appt will need to be moved to 05/07/23 due to Dr Meridee Score jury duty.  Left message on machine to call back

## 2023-04-30 ENCOUNTER — Encounter (HOSPITAL_COMMUNITY): Payer: Self-pay | Admitting: Gastroenterology

## 2023-04-30 NOTE — Progress Notes (Signed)
 Attempted to obtain medical history for pre op call via telephone, unable to reach at this time. HIPAA compliant voicemail message left requesting return call to pre surgical testing department.

## 2023-05-01 ENCOUNTER — Telehealth: Payer: Self-pay | Admitting: Gastroenterology

## 2023-05-01 NOTE — Telephone Encounter (Signed)
 Procedure:ERCP Procedure date: 05/07/23 Procedure location: [facility] Arrival Time: 6:30 am Spoke with the patient Y/N:   No, I left a detailed message for the patient to return call 05/01/23 @ 1:31 pm Talked to pt's husband 05/03/23 @ 2:30 pm  Any prep concerns? No  Has the patient obtained the prep from the pharmacy ? No prep needed Do you have a care partner and transportation: Yes Any additional concerns? No

## 2023-05-03 DIAGNOSIS — M5451 Vertebrogenic low back pain: Secondary | ICD-10-CM | POA: Diagnosis not present

## 2023-05-05 NOTE — Anesthesia Preprocedure Evaluation (Signed)
 Anesthesia Evaluation  Patient identified by MRN, date of birth, ID band Patient awake    Reviewed: Allergy & Precautions, NPO status , Patient's Chart, lab work & pertinent test results  Airway Mallampati: II  TM Distance: >3 FB Neck ROM: Full    Dental  (+) Lower Dentures   Pulmonary neg pulmonary ROS   Pulmonary exam normal        Cardiovascular hypertension, Pt. on medications  Rhythm:Regular Rate:Normal     Neuro/Psych negative neurological ROS  negative psych ROS   GI/Hepatic Neg liver ROS, PUD,GERD  Medicated,,CBD dilation   Endo/Other  negative endocrine ROS    Renal/GU negative Renal ROS  negative genitourinary   Musculoskeletal  (+) Arthritis , Osteoarthritis,    Abdominal Normal abdominal exam  (+)   Peds  Hematology negative hematology ROS (+)   Anesthesia Other Findings   Reproductive/Obstetrics                             Anesthesia Physical Anesthesia Plan  ASA: 2  Anesthesia Plan: General   Post-op Pain Management:    Induction: Intravenous  PONV Risk Score and Plan: 3 and Ondansetron, Dexamethasone and Treatment may vary due to age or medical condition  Airway Management Planned: Mask and Oral ETT  Additional Equipment: None  Intra-op Plan:   Post-operative Plan: Extubation in OR  Informed Consent: I have reviewed the patients History and Physical, chart, labs and discussed the procedure including the risks, benefits and alternatives for the proposed anesthesia with the patient or authorized representative who has indicated his/her understanding and acceptance.     Dental advisory given  Plan Discussed with: CRNA  Anesthesia Plan Comments: (- Patient with severe HTN this morning (~220s SBP) and reports not taking any of her antiHTN agents. Will treat with 10mg  hydralazine IV and if no notable improvement, she will likely be canceled today. )        Anesthesia Quick Evaluation

## 2023-05-07 ENCOUNTER — Ambulatory Visit (HOSPITAL_COMMUNITY)
Admission: RE | Admit: 2023-05-07 | Discharge: 2023-05-07 | Disposition: A | Discharge status: Home / Self Care | Payer: Medicare Other | Attending: Gastroenterology | Admitting: Gastroenterology

## 2023-05-07 ENCOUNTER — Ambulatory Visit (HOSPITAL_BASED_OUTPATIENT_CLINIC_OR_DEPARTMENT_OTHER): Admitting: Anesthesiology

## 2023-05-07 ENCOUNTER — Encounter (HOSPITAL_COMMUNITY)
Admission: RE | Disposition: A | Discharge status: Home / Self Care | Payer: Self-pay | Source: Home / Self Care | Attending: Gastroenterology

## 2023-05-07 ENCOUNTER — Ambulatory Visit (HOSPITAL_COMMUNITY)

## 2023-05-07 ENCOUNTER — Other Ambulatory Visit: Payer: Self-pay

## 2023-05-07 ENCOUNTER — Ambulatory Visit (HOSPITAL_COMMUNITY): Admitting: Anesthesiology

## 2023-05-07 ENCOUNTER — Telehealth: Payer: Self-pay

## 2023-05-07 ENCOUNTER — Encounter (HOSPITAL_COMMUNITY): Payer: Self-pay | Admitting: Gastroenterology

## 2023-05-07 DIAGNOSIS — K219 Gastro-esophageal reflux disease without esophagitis: Secondary | ICD-10-CM | POA: Insufficient documentation

## 2023-05-07 DIAGNOSIS — K8070 Calculus of gallbladder and bile duct without cholecystitis without obstruction: Secondary | ICD-10-CM | POA: Diagnosis not present

## 2023-05-07 DIAGNOSIS — K315 Obstruction of duodenum: Secondary | ICD-10-CM

## 2023-05-07 DIAGNOSIS — K3189 Other diseases of stomach and duodenum: Secondary | ICD-10-CM

## 2023-05-07 DIAGNOSIS — I10 Essential (primary) hypertension: Secondary | ICD-10-CM | POA: Diagnosis not present

## 2023-05-07 DIAGNOSIS — K838 Other specified diseases of biliary tract: Secondary | ICD-10-CM

## 2023-05-07 DIAGNOSIS — K8020 Calculus of gallbladder without cholecystitis without obstruction: Secondary | ICD-10-CM

## 2023-05-07 DIAGNOSIS — R932 Abnormal findings on diagnostic imaging of liver and biliary tract: Secondary | ICD-10-CM | POA: Diagnosis not present

## 2023-05-07 DIAGNOSIS — K8050 Calculus of bile duct without cholangitis or cholecystitis without obstruction: Secondary | ICD-10-CM

## 2023-05-07 DIAGNOSIS — K319 Disease of stomach and duodenum, unspecified: Secondary | ICD-10-CM | POA: Insufficient documentation

## 2023-05-07 DIAGNOSIS — Q399 Congenital malformation of esophagus, unspecified: Secondary | ICD-10-CM | POA: Diagnosis not present

## 2023-05-07 DIAGNOSIS — R9389 Abnormal findings on diagnostic imaging of other specified body structures: Secondary | ICD-10-CM

## 2023-05-07 DIAGNOSIS — G8929 Other chronic pain: Secondary | ICD-10-CM

## 2023-05-07 DIAGNOSIS — K831 Obstruction of bile duct: Secondary | ICD-10-CM

## 2023-05-07 DIAGNOSIS — R112 Nausea with vomiting, unspecified: Secondary | ICD-10-CM

## 2023-05-07 DIAGNOSIS — K861 Other chronic pancreatitis: Secondary | ICD-10-CM

## 2023-05-07 HISTORY — PX: STONE EXTRACTION WITH BASKET: SHX5318

## 2023-05-07 HISTORY — PX: ENDOSCOPIC RETROGRADE CHOLANGIOPANCREATOGRAPHY (ERCP) WITH PROPOFOL: SHX5810

## 2023-05-07 HISTORY — PX: SPHINCTEROTOMY: SHX5279

## 2023-05-07 SURGERY — ENDOSCOPIC RETROGRADE CHOLANGIOPANCREATOGRAPHY (ERCP) WITH PROPOFOL
Anesthesia: General

## 2023-05-07 MED ORDER — SUGAMMADEX SODIUM 200 MG/2ML IV SOLN
INTRAVENOUS | Status: DC | PRN
  Administered 2023-05-07: 200 mg via INTRAVENOUS

## 2023-05-07 MED ORDER — FENTANYL CITRATE (PF) 100 MCG/2ML IJ SOLN
INTRAMUSCULAR | Status: AC
  Filled 2023-05-07: qty 2

## 2023-05-07 MED ORDER — ONDANSETRON HCL 4 MG/2ML IJ SOLN
INTRAMUSCULAR | Status: DC | PRN
  Administered 2023-05-07: 4 mg via INTRAVENOUS

## 2023-05-07 MED ORDER — GLYCOPYRROLATE PF 0.2 MG/ML IJ SOSY
PREFILLED_SYRINGE | INTRAMUSCULAR | Status: DC | PRN
  Administered 2023-05-07: .2 mg via INTRAVENOUS

## 2023-05-07 MED ORDER — DEXAMETHASONE SODIUM PHOSPHATE 10 MG/ML IJ SOLN
INTRAMUSCULAR | Status: DC | PRN
  Administered 2023-05-07: 10 mg via INTRAVENOUS

## 2023-05-07 MED ORDER — CIPROFLOXACIN IN D5W 400 MG/200ML IV SOLN
INTRAVENOUS | Status: AC
Start: 2023-05-07 — End: ?
  Filled 2023-05-07: qty 200

## 2023-05-07 MED ORDER — ROCURONIUM BROMIDE 10 MG/ML (PF) SYRINGE
PREFILLED_SYRINGE | INTRAVENOUS | Status: DC | PRN
Start: 2023-05-07 — End: 2023-05-07
  Administered 2023-05-07: 50 mg via INTRAVENOUS

## 2023-05-07 MED ORDER — SODIUM CHLORIDE 0.9 % IV SOLN
INTRAVENOUS | Status: DC
Start: 2023-05-07 — End: 2023-05-07

## 2023-05-07 MED ORDER — EPHEDRINE SULFATE-NACL 50-0.9 MG/10ML-% IV SOSY
PREFILLED_SYRINGE | INTRAVENOUS | Status: DC | PRN
Start: 2023-05-07 — End: 2023-05-07
  Administered 2023-05-07: 10 mg via INTRAVENOUS

## 2023-05-07 MED ORDER — FENTANYL CITRATE (PF) 100 MCG/2ML IJ SOLN
INTRAMUSCULAR | Status: DC | PRN
  Administered 2023-05-07: 50 ug via INTRAVENOUS

## 2023-05-07 MED ORDER — PROPOFOL 500 MG/50ML IV EMUL
INTRAVENOUS | Status: AC
  Filled 2023-05-07: qty 50

## 2023-05-07 MED ORDER — HYDRALAZINE HCL 20 MG/ML IJ SOLN
10.0000 mg | Freq: Once | INTRAMUSCULAR | Status: AC
Start: 2023-05-07 — End: 2023-05-07
  Administered 2023-05-07: 10 mg via INTRAVENOUS

## 2023-05-07 MED ORDER — GLUCAGON HCL RDNA (DIAGNOSTIC) 1 MG IJ SOLR
INTRAMUSCULAR | Status: AC
  Filled 2023-05-07: qty 1

## 2023-05-07 MED ORDER — PHENYLEPHRINE 80 MCG/ML (10ML) SYRINGE FOR IV PUSH (FOR BLOOD PRESSURE SUPPORT)
PREFILLED_SYRINGE | INTRAVENOUS | Status: DC | PRN
  Administered 2023-05-07: 160 ug via INTRAVENOUS

## 2023-05-07 MED ORDER — SODIUM CHLORIDE 0.9 % IV SOLN
INTRAVENOUS | Status: DC | PRN
  Administered 2023-05-07: 25 mL

## 2023-05-07 MED ORDER — LIDOCAINE 2% (20 MG/ML) 5 ML SYRINGE
INTRAMUSCULAR | Status: DC | PRN
  Administered 2023-05-07: 100 mg via INTRAVENOUS

## 2023-05-07 MED ORDER — HYDRALAZINE HCL 20 MG/ML IJ SOLN
INTRAMUSCULAR | Status: AC
  Filled 2023-05-07: qty 1

## 2023-05-07 MED ORDER — DICLOFENAC SUPPOSITORY 100 MG
RECTAL | Status: DC | PRN
  Administered 2023-05-07: 100 mg via RECTAL

## 2023-05-07 MED ORDER — PROPOFOL 10 MG/ML IV BOLUS
INTRAVENOUS | Status: DC | PRN
  Administered 2023-05-07: 130 mg via INTRAVENOUS

## 2023-05-07 MED ORDER — DICLOFENAC SUPPOSITORY 100 MG
RECTAL | Status: AC
  Filled 2023-05-07: qty 1

## 2023-05-07 MED ORDER — CIPROFLOXACIN IN D5W 400 MG/200ML IV SOLN
INTRAVENOUS | Status: DC | PRN
  Administered 2023-05-07: 400 mg via INTRAVENOUS

## 2023-05-07 MED ORDER — GLUCAGON HCL RDNA (DIAGNOSTIC) 1 MG IJ SOLR
INTRAMUSCULAR | Status: DC | PRN
  Administered 2023-05-07 (×2): .25 mg via INTRAVENOUS

## 2023-05-07 NOTE — Telephone Encounter (Signed)
 Referral to Surgeon in [place]  Lab order has been entered to be completed in 2 weeks.

## 2023-05-07 NOTE — Discharge Instructions (Signed)

## 2023-05-07 NOTE — Op Note (Signed)
 [facility] Patient Name: Katie Chan Procedure Date: 05/07/2023 MRN: [id number] Attending MD: Corliss Parish , MD, [id number] Date of Birth: February 09, 1950 CSN: [id number] Age: 73 Admit Type: Outpatient Procedure:                ERCP Indications:              Common bile duct stone(s), Biliary dilation on                            Computed Tomogram Scan, Bile duct stone on magnetic                            resonance cholangiopancreatography, Pancreatic duct                            dilatation on magnetic resonance                            cholangiopancreatography Providers:                Corliss Parish, MD, Norman Clay, RN, Kandice Robinsons, Technician Referring MD:              Medicines:                General Anesthesia, Cipro 400 mg IV, Diclofenac 100                            mg rectal Complications:            No immediate complications. Estimated Blood Loss:     Estimated blood loss was minimal. Procedure:                Pre-Anesthesia Assessment:                           - Prior to the procedure, a History and Physical                            was performed, and patient medications and                            allergies were reviewed. The patient's tolerance of                            previous anesthesia was also reviewed. The risks                            and benefits of the procedure and the sedation                            options and risks were discussed with the patient.                            All questions were answered, and informed consent  was obtained. Prior Anticoagulants: The patient has                            taken no anticoagulant or antiplatelet agents. ASA                            Grade Assessment: III - A patient with severe                            systemic disease. After reviewing the risks and                            benefits, the patient was  deemed in satisfactory                            condition to undergo the procedure.                           After obtaining informed consent, the scope was                            passed under direct vision. Throughout the                            procedure, the patient's blood pressure, pulse, and                            oxygen saturations were monitored continuously. The                            [id number]-H190 ([id number] Olympus endoscope was introduced                            through the mouth, and used to inject contrast into                            and used to locate the major papilla. The [employer] D single use duodenoscope                            was introduced through the mouth, and used to                            inject contrast into and used to inject contrast                            into the bile duct. Scope In: Scope Out: Findings:      The scout film was normal. A standard esophagogastroduodenoscopy scope       was used for the examination of the upper gastrointestinal tract       initially. The scope was passed under direct vision through  the upper GI       tract. The examined esophagus was moderately tortuous. The Z-line was       regular and was found 37 cm from the incisors. A J-shaped deformity was       found in the stomach. No gross lesions were noted in the entire examined       stomach otherwise. An acquired benign-appearing, intrinsic stenosis was       found in the duodenal angle and was easily traversed with the adult       endoscope and traversed with some gentle pressure with the duodenoscope.       The major papilla was normal.      A 0.035 inch x 260 cm straight Hydra Jagwire was passed into the biliary       tree. The Hydratome sphincterotome was passed over the guidewire and the       bile duct was then deeply cannulated. Contrast was injected. I       personally interpreted the bile duct  images. Ductal flow of contrast was       adequate. Image quality was adequate. Contrast extended to the entire       biliary tree. Opacification of the entire opacified area was successful.       The main bile duct was moderately dilated. The largest diameter was 14       mm. The middle third of the main bile duct contained a filling defects       thought to be a stone and sludge. The cystic duct and gallbladder       contained multiple stones. A 10 mm biliary sphincterotomy was made with       a monofilament Hydratome sphincterotome using ERBE electrocautery. There       was no post-sphincterotomy bleeding. To discover objects, the biliary       tree was swept with a retrieval balloon. Sludge was swept from the duct.       One stone was removed. No stones remained. An occlusion cholangiogram       was performed that showed no further significant biliary pathology.      A pancreatogram was not performed.      The duodenoscope was withdrawn from the patient. Impression:               - Tortuous esophagus. Z-line regular, 37 cm from                            the incisors.                           - J-shaped gastric deformity.                           - No other gross lesions in the entire stomach.                           - Acquired duodenal stenosis in the duodenal angle.                           - The major papilla appeared normal.                           -  A filling defect consistent with a stone and                            sludge was seen on the cholangiogram.                           - The entire main bile duct was moderately dilated.                           - Choledocholithiasis was found. Complete removal                            was accomplished by biliary sphincterotomy and                            balloon sweep. Moderate Sedation:      Not Applicable - Patient had care per Anesthesia. Recommendation:           - The patient will be observed post-procedure,                             until all discharge criteria are met.                           - Discharge patient to home.                           - Patient has a contact number available for                            emergencies. The signs and symptoms of potential                            delayed complications were discussed with the                            patient. Return to normal activities tomorrow.                            Written discharge instructions were provided to the                            patient.                           - Observe patient's clinical course.                           - Check liver enzymes (AST, ALT, alkaline                            phosphatase, bilirubin) in 2 weeks.                           - Watch for pancreatitis, bleeding, perforation,  and cholangitis.                           - Refer to a surgeon to discuss cholecystectomy.                           - Consider repeat MRI/MRCP in 6 months for PD                            dilation, with primary gastroenterologist team.                           - The findings and recommendations were discussed                            with the patient.                           - The findings and recommendations were discussed                            with the patient's family. Procedure Code(s):        --- Professional ---                           [phone], Endoscopic retrograde                            cholangiopancreatography (ERCP); with removal of                            calculi/debris from biliary/pancreatic duct(s)                           [id number], Endoscopic retrograde                            cholangiopancreatography (ERCP); with                            sphincterotomy/papillotomy                           [phone], Endoscopic catheterization of the biliary                            ductal system, radiological supervision and                             interpretation Diagnosis Code(s):        --- Professional ---                           Q39.9, Congenital malformation of esophagus,                            unspecified  K31.89, Other diseases of stomach and duodenum                           K31.5, Obstruction of duodenum                           K83.8, Other specified diseases of biliary tract                           K80.50, Calculus of bile duct without cholangitis                            or cholecystitis without obstruction                           R93.2, Abnormal findings on diagnostic imaging of                            liver and biliary tract CPT copyright 2022 American [employer] Association. All rights reserved. The codes documented in this report are preliminary and upon coder review may  be revised to meet current compliance requirements. Yong Henle, MD 05/07/2023 8:59:00 AM Number of Addenda: 0

## 2023-05-07 NOTE — Transfer of Care (Signed)
 Immediate Anesthesia Transfer of Care Note  Patient: Katie Chan  Procedure(s) Performed: ENDOSCOPIC RETROGRADE CHOLANGIOPANCREATOGRAPHY (ERCP) WITH PROPOFOL SPHINCTEROTOMY ERCP, WITH LITHROTRIPSY OR REMOVAL OF COMMON BILE DUCT CALCULUS USING BASKET  Patient Location: PACU  Anesthesia Type:General  Level of Consciousness: sedated  Airway & Oxygen Therapy: Patient Spontanous Breathing and Patient connected to face mask oxygen  Post-op Assessment: Report given to RN and Post -op Vital signs reviewed and stable  Post vital signs: Reviewed and stable  Last Vitals:  Vitals Value Taken Time  BP    Temp    Pulse    Resp    SpO2      Last Pain:  Vitals:   05/07/23 0635  TempSrc: Tympanic  PainSc: 0-No pain         Complications: No notable events documented.

## 2023-05-07 NOTE — Anesthesia Postprocedure Evaluation (Signed)
 Anesthesia Post Note  Patient: URA YINGLING  Procedure(s) Performed: ENDOSCOPIC RETROGRADE CHOLANGIOPANCREATOGRAPHY (ERCP) WITH PROPOFOL SPHINCTEROTOMY ERCP, WITH LITHROTRIPSY OR REMOVAL OF COMMON BILE DUCT CALCULUS USING BASKET     Patient location during evaluation: PACU Anesthesia Type: General Level of consciousness: awake and alert Pain management: pain level controlled Vital Signs Assessment: post-procedure vital signs reviewed and stable Respiratory status: spontaneous breathing, nonlabored ventilation, respiratory function stable and patient connected to nasal cannula oxygen Cardiovascular status: blood pressure returned to baseline and stable Postop Assessment: no apparent nausea or vomiting Anesthetic complications: no   No notable events documented.  Last Vitals:  Vitals:   05/07/23 0910 05/07/23 0920  BP: (!) 148/56 (!) 142/56  Pulse: 74 68  Resp: 14 18  Temp:    SpO2: 97% 97%    Last Pain:  Vitals:   05/07/23 0920  TempSrc:   PainSc: 0-No pain                 Valente Gaskin Isella Slatten

## 2023-05-07 NOTE — Anesthesia Procedure Notes (Signed)
 Procedure Name: Intubation Date/Time: 05/07/2023 8:09 AM  Performed by: Micky Albee, CRNAPre-anesthesia Checklist: Patient identified, Emergency Drugs available, Suction available, Patient being monitored and Timeout performed Patient Re-evaluated:Patient Re-evaluated prior to induction Oxygen Delivery Method: Circle system utilized Preoxygenation: Pre-oxygenation with 100% oxygen Induction Type: IV induction Ventilation: Mask ventilation without difficulty Laryngoscope Size: Mac and 3 Grade View: Grade I Tube type: Oral Tube size: 7.0 mm Number of attempts: 1 Airway Equipment and Method: Stylet Placement Confirmation: ETT inserted through vocal cords under direct vision, positive ETCO2 and breath sounds checked- equal and bilateral Secured at: 22 cm Tube secured with: Tape Dental Injury: Teeth and Oropharynx as per pre-operative assessment

## 2023-05-07 NOTE — H&P (Signed)
 GASTROENTEROLOGY PROCEDURE H&P NOTE   Primary Care Physician: Lawerance Sabal, PA  HPI: Katie Chan is a 73 y.o. female who presents for ERCP for choledocholithiasis on MRICP and for history of duodenal stricture preventing issues previously now s/p duodenal stricture dilation.  Past Medical History:  Diagnosis Date   Arthritis    Carotid artery disease (HCC)    Essential hypertension    Fracture of radius, distal, right, closed 05/04/2015   GERD (gastroesophageal reflux disease)    Past Surgical History:  Procedure Laterality Date   BALLOON DILATION  11/16/2020   Procedure: BALLOON DILATION;  Surgeon: Dolores Frame, MD;  [facility];  Service: Gastroenterology;;   Marvis Repress DILATION  02/13/2023   Procedure: Rubye Beach;  Surgeon: Dolores Frame, MD;  [facility];  Service: Gastroenterology;;   Marvis Repress DILATION  03/21/2023   Procedure: Rubye Beach;  Surgeon: Dolores Frame, MD;  [facility];  Service: Gastroenterology;;   BIOPSY  01/22/2023   Procedure: BIOPSY;  Surgeon: Lemar Lofty., MD;  Location: [facility];  Service: Gastroenterology;;   ERCP N/A 01/22/2023   Procedure: ENDOSCOPIC RETROGRADE CHOLANGIOPANCREATOGRAPHY (ERCP);  Surgeon: Lemar Lofty., MD;  Location: [facility];  Service: Gastroenterology;  Laterality: N/A;  cancelled due to pylori ulcer   ESOPHAGOGASTRODUODENOSCOPY N/A 01/22/2023   Procedure: ESOPHAGOGASTRODUODENOSCOPY (EGD);  Surgeon: Lemar Lofty., MD;  Location: [facility];  Service: Gastroenterology;  Laterality: N/A;   ESOPHAGOGASTRODUODENOSCOPY (EGD) WITH PROPOFOL N/A 11/16/2020   Procedure: ESOPHAGOGASTRODUODENOSCOPY (EGD) WITH PROPOFOL;  Surgeon: Dolores Frame, MD;  [facility];  Service: Gastroenterology;  Laterality: N/A;  7:30   ESOPHAGOGASTRODUODENOSCOPY (EGD) WITH PROPOFOL N/A 02/13/2023   Procedure:  ESOPHAGOGASTRODUODENOSCOPY (EGD) WITH PROPOFOL;  Surgeon: Dolores Frame, MD;  [facility];  Service: Gastroenterology;  Laterality: N/A;  2:00pm;asa 1-2   ESOPHAGOGASTRODUODENOSCOPY (EGD) WITH PROPOFOL N/A 03/06/2023   Procedure: ESOPHAGOGASTRODUODENOSCOPY (EGD) WITH PROPOFOL;  Surgeon: Dolores Frame, MD;  [facility];  Service: Gastroenterology;  Laterality: N/A;  1:45PM;ASA 1-2   ESOPHAGOGASTRODUODENOSCOPY (EGD) WITH PROPOFOL N/A 03/21/2023   Procedure: ESOPHAGOGASTRODUODENOSCOPY (EGD) WITH PROPOFOL;  Surgeon: Dolores Frame, MD;  [facility];  Service: Gastroenterology;  Laterality: N/A;  1:00PM;ASA 1-2   OPEN REDUCTION INTERNAL FIXATION (ORIF) DISTAL RADIAL FRACTURE Right 05/04/2015   Procedure: OPEN REDUCTION INTERNAL FIXATION (ORIF) RIGHT DISTAL RADIUS FRACTURE;  Surgeon: Teryl Lucy, MD;  Location: [facility];  Service: Orthopedics;  Laterality: Right;   TONSILLECTOMY AND ADENOIDECTOMY     Current Facility-Administered Medications  Medication Dose Route Frequency Provider Last Rate Last Admin   0.9 %  sodium chloride infusion   Intravenous Continuous Mansouraty, Netty Starring., MD   New Bag at 05/07/23 0715    Current Facility-Administered Medications:    0.9 %  sodium chloride infusion, , Intravenous, Continuous, Mansouraty, Netty Starring., MD, New Bag at 05/07/23 0715 No Known Allergies Family History  Problem Relation Age of Onset   Heart disease Mother        stents placed in heart, PPM   Diabetes Mother    Heart attack Mother    Heart disease Father    Heart attack Father    COPD Father    Diabetes Sister    Cancer Maternal Grandfather    Heart attack Paternal Grandfather 56       massive heart attack   Colon cancer Neg Hx    Colon polyps Neg Hx  Social History   Socioeconomic History   Marital status: Married    Spouse name: Not on file   Number of children: Not on file   Years of  education: Not on file   Highest education level: Not on file  Occupational History   Not on file  Tobacco Use   Smoking status: Never   Smokeless tobacco: Never  Vaping Use   Vaping status: Never Used  Substance and Sexual Activity   Alcohol use: Never   Drug use: Never   Sexual activity: Not Currently  Other Topics Concern   Not on file  Social History Narrative   Not on file   Social Drivers of Health   Financial Resource Strain: Low Risk  (10/31/2021)   Received from [facility], [facility]   Overall Financial Resource Strain (CARDIA)    Difficulty of Paying Living Expenses: Not hard at all  Food Insecurity: No Food Insecurity (10/31/2021)   Received from [facility], [facility]   Hunger Vital Sign    Worried About Running Out of Food in the Last Year: Never true    Ran Out of Food in the Last Year: Never true  Transportation Needs: No Transportation Needs (10/31/2021)   Received from [facility], [facility]   [facility] - Transportation    Lack of Transportation (Medical): No    Lack of Transportation (Non-Medical): No  Physical Activity: Not on file  Stress: Not on file  Social Connections: Not on file  Intimate Partner Violence: Not on file    Physical Exam: Today's Vitals   05/07/23 0635 05/07/23 0648 05/07/23 0730  BP: (!) 219/61 (!) 213/60 (!) 181/57  Pulse: (!) 40 (!) 38 (!) 41  Resp: 12  15  Temp: 98 F (36.7 C)    TempSrc: Tympanic    SpO2: 100%  100%  Weight: 63.5 kg    Height: 5\' 2"  (1.575 m)    PainSc: 0-No pain     Body mass index is 25.6 kg/m. GEN: NAD EYE: Sclerae anicteric ENT: MMM CV: Non-tachycardic GI: Soft, NT/ND NEURO:  Alert & Oriented x 3  Lab Results: No results for input(s): "WBC", "HGB", "HCT", "PLT" in the last 72 hours. BMET No results for input(s): "NA", "K", "CL", "CO2", "GLUCOSE", "BUN", "CREATININE", "CALCIUM" in the last 72 hours. LFT No results for input(s): "PROT", "ALBUMIN", "AST", "ALT",  "ALKPHOS", "BILITOT", "BILIDIR", "IBILI" in the last 72 hours. PT/INR No results for input(s): "LABPROT", "INR" in the last 72 hours.   Impression / Plan: This is a 73 y.o.female who presents for ERCP for choledocholithiasis on MRICP and for history of duodenal stricture preventing issues previously now s/p duodenal stricture dilation.  The risks of an ERCP were discussed at length, including but not limited to the risk of perforation, bleeding, abdominal pain, post-ERCP pancreatitis (while usually mild can be severe and even life threatening).   The risks and benefits of endoscopic evaluation/treatment were discussed with the patient and/or family; these include but are not limited to the risk of perforation, infection, bleeding, missed lesions, lack of diagnosis, severe illness requiring hospitalization, as well as anesthesia and sedation related illnesses.  The patient's history has been reviewed, patient examined, no change in status, and deemed stable for procedure.  The patient and/or family is agreeable to proceed.    Corliss Parish, MD [facility] Office # [phone]

## 2023-05-07 NOTE — Telephone Encounter (Signed)
-----   Message from [facility] sent at 05/07/2023  9:14 AM EDT ----- Regarding: Followup Katie Chan, This patient needs the following for follow-up after her ERCP today: 1) hepatic function panel in 2 weeks (this can be done at [facility]) 2) referral to Dr. Cherilyn Corn or Dr. Larrie Po of [place] [facility] for consideration of cholecystectomy for history of choledocholithiasis and cholelithiasis-patient agrees to move forward with consultation 3) MRI/MRCP in 6 months, though this will be coordinated by her primary gastroenterology team at [facility] (so you do not need to order that) Thanks. GM

## 2023-05-08 ENCOUNTER — Encounter (HOSPITAL_COMMUNITY): Payer: Self-pay | Admitting: Gastroenterology

## 2023-05-15 ENCOUNTER — Encounter: Payer: Self-pay | Admitting: General Surgery

## 2023-05-15 ENCOUNTER — Ambulatory Visit: Admitting: General Surgery

## 2023-05-15 VITALS — BP 150/69 | HR 51 | Temp 98.4°F | Resp 14 | Ht 62.0 in | Wt 133.0 lb

## 2023-05-15 DIAGNOSIS — K8020 Calculus of gallbladder without cholecystitis without obstruction: Secondary | ICD-10-CM

## 2023-05-15 NOTE — Progress Notes (Signed)
 Katie Chan; [id number]; 12/14/1950   HPI Patient is a 73 year old white female who was referred to my care by Drs. Castaneda and Mansouraty of GI for evaluation and treatment for cholelithiasis.  Patient is status post ERCP with stone extraction on 05/07/2023.  She was noted to have multiple gallstones in the gallbladder lumen.  She has also had multiple EGDs for balloon dilatation.  She currently denies any right upper quadrant abdominal pain, nausea, vomiting, fever, chills, or jaundice since the procedure. Past Medical History:  Diagnosis Date   Arthritis    Carotid artery disease (HCC)    Essential hypertension    Fracture of radius, distal, right, closed 05/04/2015   GERD (gastroesophageal reflux disease)     Past Surgical History:  Procedure Laterality Date   BALLOON DILATION  11/16/2020   Procedure: BALLOON DILATION;  Surgeon: Urban Garden, MD;  [facility];  Service: Gastroenterology;;   Debborah Fairly DILATION  02/13/2023   Procedure: Laurie Poplar;  Surgeon: Urban Garden, MD;  [facility];  Service: Gastroenterology;;   Debborah Fairly DILATION  03/21/2023   Procedure: Laurie Poplar;  Surgeon: Urban Garden, MD;  [facility];  Service: Gastroenterology;;   BIOPSY  01/22/2023   Procedure: BIOPSY;  Surgeon: Normie Becton., MD;  Location: [facility];  Service: Gastroenterology;;   ENDOSCOPIC RETROGRADE CHOLANGIOPANCREATOGRAPHY (ERCP) WITH PROPOFOL  N/A 05/07/2023   Procedure: ENDOSCOPIC RETROGRADE CHOLANGIOPANCREATOGRAPHY (ERCP) WITH PROPOFOL ;  Surgeon: Normie Becton., MD;  Location: [facility];  Service: Gastroenterology;  Laterality: N/A;   ERCP N/A 01/22/2023   Procedure: ENDOSCOPIC RETROGRADE CHOLANGIOPANCREATOGRAPHY (ERCP);  Surgeon: Normie Becton., MD;  Location: [facility];  Service: Gastroenterology;  Laterality: N/A;  cancelled due to pylori ulcer   ESOPHAGOGASTRODUODENOSCOPY  N/A 01/22/2023   Procedure: ESOPHAGOGASTRODUODENOSCOPY (EGD);  Surgeon: Normie Becton., MD;  Location: [facility];  Service: Gastroenterology;  Laterality: N/A;   ESOPHAGOGASTRODUODENOSCOPY (EGD) WITH PROPOFOL  N/A 11/16/2020   Procedure: ESOPHAGOGASTRODUODENOSCOPY (EGD) WITH PROPOFOL ;  Surgeon: Urban Garden, MD;  [facility];  Service: Gastroenterology;  Laterality: N/A;  7:30   ESOPHAGOGASTRODUODENOSCOPY (EGD) WITH PROPOFOL  N/A 02/13/2023   Procedure: ESOPHAGOGASTRODUODENOSCOPY (EGD) WITH PROPOFOL ;  Surgeon: Urban Garden, MD;  [facility];  Service: Gastroenterology;  Laterality: N/A;  2:00pm;asa 1-2   ESOPHAGOGASTRODUODENOSCOPY (EGD) WITH PROPOFOL  N/A 03/06/2023   Procedure: ESOPHAGOGASTRODUODENOSCOPY (EGD) WITH PROPOFOL ;  Surgeon: Urban Garden, MD;  [facility];  Service: Gastroenterology;  Laterality: N/A;  1:45PM;ASA 1-2   ESOPHAGOGASTRODUODENOSCOPY (EGD) WITH PROPOFOL  N/A 03/21/2023   Procedure: ESOPHAGOGASTRODUODENOSCOPY (EGD) WITH PROPOFOL ;  Surgeon: Urban Garden, MD;  [facility];  Service: Gastroenterology;  Laterality: N/A;  1:00PM;ASA 1-2   OPEN REDUCTION INTERNAL FIXATION (ORIF) DISTAL RADIAL FRACTURE Right 05/04/2015   Procedure: OPEN REDUCTION INTERNAL FIXATION (ORIF) RIGHT DISTAL RADIUS FRACTURE;  Surgeon: Osa Blase, MD;  Location: [facility];  Service: Orthopedics;  Laterality: Right;   SPHINCTEROTOMY  05/07/2023   Procedure: SPHINCTEROTOMY;  Surgeon: Mansouraty, Albino Alu., MD;  Location: [facility];  Service: Gastroenterology;;   STONE EXTRACTION WITH BASKET  05/07/2023   Procedure: ERCP, WITH LITHROTRIPSY OR REMOVAL OF COMMON BILE DUCT CALCULUS USING BASKET;  Surgeon: Mansouraty, Albino Alu., MD;  Location: [facility];  Service: Gastroenterology;;   TONSILLECTOMY AND ADENOIDECTOMY      Family History  Problem Relation Age of Onset   Heart disease  Mother        stents placed in heart, PPM  Diabetes Mother    Heart attack Mother    Heart disease Father    Heart attack Father    COPD Father    Diabetes Sister    Cancer Maternal Grandfather    Heart attack Paternal Grandfather 30       massive heart attack   Colon cancer Neg Hx    Colon polyps Neg Hx     Current Outpatient Medications on File Prior to Visit  Medication Sig Dispense Refill   amLODipine  (NORVASC ) 10 MG tablet Take 1 tablet (10 mg total) by mouth daily. 90 tablet 1   Blood Pressure Monitoring (OMRON 3 SERIES BP MONITOR) DEVI Use as directed 1 each 1   dicyclomine (BENTYL) 10 MG capsule Take 10 mg by mouth in the morning and at bedtime.     gabapentin (NEURONTIN) 300 MG capsule Take 300 mg by mouth daily.     hydrochlorothiazide (HYDRODIURIL) 25 MG tablet Take 25 mg by mouth every morning.     lisinopril (ZESTRIL) 40 MG tablet Take 40 mg by mouth daily.     omeprazole  (PRILOSEC) 40 MG capsule Take 1 capsule (40 mg total) by mouth 2 (two) times daily before a meal. 60 capsule 12   ondansetron  (ZOFRAN -ODT) 4 MG disintegrating tablet SMARTSIG:Sublingual     OVER THE COUNTER MEDICATION Vit b 12 one daily     sucralfate  (CARAFATE ) 1 g tablet Take 1 tablet (1 g total) by mouth 4 (four) times daily. 4 times daily for 2 months then twice daily. 120 tablet 6   No current facility-administered medications on file prior to visit.    No Known Allergies  Social History   Substance and Sexual Activity  Alcohol Use Never    Social History   Tobacco Use  Smoking Status Never  Smokeless Tobacco Never    Review of Systems  Constitutional: Negative.   HENT: Negative.    Eyes: Negative.   Respiratory: Negative.    Cardiovascular: Negative.   Gastrointestinal:  Positive for abdominal pain, heartburn and nausea.  Genitourinary: Negative.   Musculoskeletal: Negative.   Skin: Negative.   Neurological: Negative.   Endo/Heme/Allergies: Negative.    Psychiatric/Behavioral: Negative.      Objective   Vitals:   05/15/23 1511  BP: (!) 150/69  Pulse: (!) 51  Resp: 14  Temp: 98.4 F (36.9 C)  SpO2: 95%    Physical Exam Vitals reviewed.  Constitutional:      Appearance: Normal appearance. She is normal weight. She is not ill-appearing.  HENT:     Head: Normocephalic and atraumatic.  Eyes:     General: No scleral icterus. Cardiovascular:     Rate and Rhythm: Normal rate and regular rhythm.     Heart sounds: Normal heart sounds. No murmur heard.    No friction rub. No gallop.  Pulmonary:     Effort: Pulmonary effort is normal. No respiratory distress.     Breath sounds: Normal breath sounds. No stridor. No wheezing, rhonchi or rales.  Abdominal:     General: Bowel sounds are normal. There is no distension.     Palpations: Abdomen is soft. There is no mass.     Tenderness: There is no abdominal tenderness. There is no guarding or rebound.     Hernia: No hernia is present.  Skin:    General: Skin is warm and dry.  Neurological:     Mental Status: She is alert and oriented to person, place, and time.   ERCP notes  reviewed  Assessment  Cholelithiasis, status post ERCP with stone extraction for choledocholithiasis Plan  Patient is scheduled for robotic assisted laparoscopic cholecystectomy on 06/04/2023.  The risks and benefits of the procedure including bleeding, infection, hepatobiliary injury, and the possibility of an open procedure were fully explained to the patient, who gave informed consent.

## 2023-05-15 NOTE — H&P (Signed)
 Katie Chan; [id number]; 12/14/1950   HPI Patient is a 73 year old white female who was referred to my care by Drs. Castaneda and Mansouraty of GI for evaluation and treatment for cholelithiasis.  Patient is status post ERCP with stone extraction on 05/07/2023.  She was noted to have multiple gallstones in the gallbladder lumen.  She has also had multiple EGDs for balloon dilatation.  She currently denies any right upper quadrant abdominal pain, nausea, vomiting, fever, chills, or jaundice since the procedure. Past Medical History:  Diagnosis Date   Arthritis    Carotid artery disease (HCC)    Essential hypertension    Fracture of radius, distal, right, closed 05/04/2015   GERD (gastroesophageal reflux disease)     Past Surgical History:  Procedure Laterality Date   BALLOON DILATION  11/16/2020   Procedure: BALLOON DILATION;  Surgeon: Urban Garden, MD;  [facility]: [facility];  Service: Gastroenterology;;   Debborah Fairly DILATION  02/13/2023   Procedure: Laurie Poplar;  Surgeon: Urban Garden, MD;  [facility];  Service: Gastroenterology;;   Debborah Fairly DILATION  03/21/2023   Procedure: Laurie Poplar;  Surgeon: Urban Garden, MD;  [facility];  Service: Gastroenterology;;   BIOPSY  01/22/2023   Procedure: BIOPSY;  Surgeon: Normie Becton., MD;  Location: [facility];  Service: Gastroenterology;;   ENDOSCOPIC RETROGRADE CHOLANGIOPANCREATOGRAPHY (ERCP) WITH PROPOFOL  N/A 05/07/2023   Procedure: ENDOSCOPIC RETROGRADE CHOLANGIOPANCREATOGRAPHY (ERCP) WITH PROPOFOL ;  Surgeon: Normie Becton., MD;  Location: [facility];  Service: Gastroenterology;  Laterality: N/A;   ERCP N/A 01/22/2023   Procedure: ENDOSCOPIC RETROGRADE CHOLANGIOPANCREATOGRAPHY (ERCP);  Surgeon: Normie Becton., MD;  Location: [facility];  Service: Gastroenterology;  Laterality: N/A;  cancelled due to pylori ulcer   ESOPHAGOGASTRODUODENOSCOPY  N/A 01/22/2023   Procedure: ESOPHAGOGASTRODUODENOSCOPY (EGD);  Surgeon: Normie Becton., MD;  Location: [facility];  Service: Gastroenterology;  Laterality: N/A;   ESOPHAGOGASTRODUODENOSCOPY (EGD) WITH PROPOFOL  N/A 11/16/2020   Procedure: ESOPHAGOGASTRODUODENOSCOPY (EGD) WITH PROPOFOL ;  Surgeon: Urban Garden, MD;  [facility];  Service: Gastroenterology;  Laterality: N/A;  7:30   ESOPHAGOGASTRODUODENOSCOPY (EGD) WITH PROPOFOL  N/A 02/13/2023   Procedure: ESOPHAGOGASTRODUODENOSCOPY (EGD) WITH PROPOFOL ;  Surgeon: Urban Garden, MD;  [facility];  Service: Gastroenterology;  Laterality: N/A;  2:00pm;asa 1-2   ESOPHAGOGASTRODUODENOSCOPY (EGD) WITH PROPOFOL  N/A 03/06/2023   Procedure: ESOPHAGOGASTRODUODENOSCOPY (EGD) WITH PROPOFOL ;  Surgeon: Urban Garden, MD;  [facility];  Service: Gastroenterology;  Laterality: N/A;  1:45PM;ASA 1-2   ESOPHAGOGASTRODUODENOSCOPY (EGD) WITH PROPOFOL  N/A 03/21/2023   Procedure: ESOPHAGOGASTRODUODENOSCOPY (EGD) WITH PROPOFOL ;  Surgeon: Urban Garden, MD;  [facility];  Service: Gastroenterology;  Laterality: N/A;  1:00PM;ASA 1-2   OPEN REDUCTION INTERNAL FIXATION (ORIF) DISTAL RADIAL FRACTURE Right 05/04/2015   Procedure: OPEN REDUCTION INTERNAL FIXATION (ORIF) RIGHT DISTAL RADIUS FRACTURE;  Surgeon: Osa Blase, MD;  Location: [facility];  Service: Orthopedics;  Laterality: Right;   SPHINCTEROTOMY  05/07/2023   Procedure: SPHINCTEROTOMY;  Surgeon: Mansouraty, Albino Alu., MD;  Location: [facility];  Service: Gastroenterology;;   STONE EXTRACTION WITH BASKET  05/07/2023   Procedure: ERCP, WITH LITHROTRIPSY OR REMOVAL OF COMMON BILE DUCT CALCULUS USING BASKET;  Surgeon: Mansouraty, Albino Alu., MD;  Location: [facility];  Service: Gastroenterology;;   TONSILLECTOMY AND ADENOIDECTOMY      Family History  Problem Relation Age of Onset   Heart disease  Mother        stents placed in heart, PPM  Diabetes Mother    Heart attack Mother    Heart disease Father    Heart attack Father    COPD Father    Diabetes Sister    Cancer Maternal Grandfather    Heart attack Paternal Grandfather 30       massive heart attack   Colon cancer Neg Hx    Colon polyps Neg Hx     Current Outpatient Medications on File Prior to Visit  Medication Sig Dispense Refill   amLODipine  (NORVASC ) 10 MG tablet Take 1 tablet (10 mg total) by mouth daily. 90 tablet 1   Blood Pressure Monitoring (OMRON 3 SERIES BP MONITOR) DEVI Use as directed 1 each 1   dicyclomine (BENTYL) 10 MG capsule Take 10 mg by mouth in the morning and at bedtime.     gabapentin (NEURONTIN) 300 MG capsule Take 300 mg by mouth daily.     hydrochlorothiazide (HYDRODIURIL) 25 MG tablet Take 25 mg by mouth every morning.     lisinopril (ZESTRIL) 40 MG tablet Take 40 mg by mouth daily.     omeprazole  (PRILOSEC) 40 MG capsule Take 1 capsule (40 mg total) by mouth 2 (two) times daily before a meal. 60 capsule 12   ondansetron  (ZOFRAN -ODT) 4 MG disintegrating tablet SMARTSIG:Sublingual     OVER THE COUNTER MEDICATION Vit b 12 one daily     sucralfate  (CARAFATE ) 1 g tablet Take 1 tablet (1 g total) by mouth 4 (four) times daily. 4 times daily for 2 months then twice daily. 120 tablet 6   No current facility-administered medications on file prior to visit.    No Known Allergies  Social History   Substance and Sexual Activity  Alcohol Use Never    Social History   Tobacco Use  Smoking Status Never  Smokeless Tobacco Never    Review of Systems  Constitutional: Negative.   HENT: Negative.    Eyes: Negative.   Respiratory: Negative.    Cardiovascular: Negative.   Gastrointestinal:  Positive for abdominal pain, heartburn and nausea.  Genitourinary: Negative.   Musculoskeletal: Negative.   Skin: Negative.   Neurological: Negative.   Endo/Heme/Allergies: Negative.    Psychiatric/Behavioral: Negative.      Objective   Vitals:   05/15/23 1511  BP: (!) 150/69  Pulse: (!) 51  Resp: 14  Temp: 98.4 F (36.9 C)  SpO2: 95%    Physical Exam Vitals reviewed.  Constitutional:      Appearance: Normal appearance. She is normal weight. She is not ill-appearing.  HENT:     Head: Normocephalic and atraumatic.  Eyes:     General: No scleral icterus. Cardiovascular:     Rate and Rhythm: Normal rate and regular rhythm.     Heart sounds: Normal heart sounds. No murmur heard.    No friction rub. No gallop.  Pulmonary:     Effort: Pulmonary effort is normal. No respiratory distress.     Breath sounds: Normal breath sounds. No stridor. No wheezing, rhonchi or rales.  Abdominal:     General: Bowel sounds are normal. There is no distension.     Palpations: Abdomen is soft. There is no mass.     Tenderness: There is no abdominal tenderness. There is no guarding or rebound.     Hernia: No hernia is present.  Skin:    General: Skin is warm and dry.  Neurological:     Mental Status: She is alert and oriented to person, place, and time.   ERCP notes  reviewed  Assessment  Cholelithiasis, status post ERCP with stone extraction for choledocholithiasis Plan  Patient is scheduled for robotic assisted laparoscopic cholecystectomy on 06/04/2023.  The risks and benefits of the procedure including bleeding, infection, hepatobiliary injury, and the possibility of an open procedure were fully explained to the patient, who gave informed consent.

## 2023-05-22 ENCOUNTER — Other Ambulatory Visit (HOSPITAL_COMMUNITY): Payer: Self-pay | Admitting: Otolaryngology

## 2023-05-22 DIAGNOSIS — M5451 Vertebrogenic low back pain: Secondary | ICD-10-CM | POA: Diagnosis not present

## 2023-05-30 NOTE — Patient Instructions (Signed)
 MARIELLA Chan  05/30/2023     @PREFPERIOPPHARMACY @   Your procedure is scheduled on  06/04/2023.   Report to [facility] at  0600 A.M.   Call this number if you have problems the morning of surgery:  [phone]  If you experience any cold or flu symptoms such as cough, fever, chills, shortness of breath, etc. between now and your scheduled surgery, please notify us  at the above number.   Remember:  Do not eat after midnight.   You may drink clear liquids until  0330 am on 06/04/2023.    Clear liquids allowed are:                    Water, Juice (No red color; non-citric and without pulp; diabetics please choose diet or no sugar options), Carbonated beverages (diabetics please choose diet or no sugar options), Clear Tea (No creamer, milk, or cream, including half & half and powdered creamer), Black Coffee Only (No creamer, milk or cream, including half & half and powdered creamer), and Clear Sports drink (No red color; diabetics please choose diet or no sugar options)    Take these medicines the morning of surgery with A SIP OF WATER                       amlodipine , gabapentin, omeprazole , zofran  (if needed).    Do not wear jewelry, make-up or nail polish, including gel polish,  artificial nails, or any other type of covering on natural nails (fingers and  toes).  Do not wear lotions, powders, or perfumes, or deodorant.  Do not shave 48 hours prior to surgery.  Men may shave face and neck.  Do not bring valuables to the hospital.  [facility] is not responsible for any belongings or valuables.  Contacts, dentures or bridgework may not be worn into surgery.  Leave your suitcase in the car.  After surgery it may be brought to your room.  For patients admitted to the hospital, discharge time will be determined by your treatment team.  Patients discharged the day of surgery will not be allowed to drive home and must have someone with them for 24 hours.    Special  instructions:            DO NOT smoke tobacco or vape for 24 hours before your procedure.  Please read over the following fact sheets that you were given. Pain Booklet, Coughing and Deep Breathing, Surgical Site Infection Prevention, Anesthesia Post-op Instructions, and Care and Recovery After Surgery      Minimally Invasive Cholecystectomy, Care After The following information offers guidance on how to care for yourself after your procedure. Your health care provider may also give you more specific instructions. If you have problems or questions, contact your health care provider. What can I expect after the procedure? After the procedure, it is common to have: Pain at your incision sites. You will be given medicines to control this pain. Mild nausea or vomiting. Bloating and possible shoulder pain from the gas that was used during the procedure. Follow these instructions at home: Medicines Take over-the-counter and prescription medicines only as told by your health care provider. If you were prescribed an antibiotic medicine, take it as told by your health care provider. Do not stop using the antibiotic even if you start to feel better. Ask your health care provider if the medicine prescribed to you: Requires  you to avoid driving or using machinery. Can cause constipation. You may need to take these actions to prevent or treat constipation: Drink enough fluid to keep your urine pale yellow. Take over-the-counter or prescription medicines. Eat foods that are high in fiber, such as beans, whole grains, and fresh fruits and vegetables. Limit foods that are high in fat and processed sugars, such as fried or sweet foods. Incision care  Follow instructions from your health care provider about how to take care of your incisions. Make sure you: Wash your hands with soap and water for at least 20 seconds before and after you change your bandage (dressing). If soap and water are not available,  use hand sanitizer. Change your dressing as told by your health care provider. Leave stitches (sutures), skin glue, or adhesive strips in place. These skin closures may need to be in place for 2 weeks or longer. If adhesive strip edges start to loosen and curl up, you may trim the loose edges. Do not remove adhesive strips completely unless your health care provider tells you to do that. Do not take baths, swim, or use a hot tub until your health care provider approves. Ask your health care provider if you may take showers. You may only be allowed to take sponge baths. Check your incision area every day for signs of infection. Check for: More redness, swelling, or pain. Fluid or blood. Warmth. Pus or a bad smell. Activity Rest as told by your health care provider. Do not do activities that require a lot of effort. Avoid sitting for a long time without moving. Get up to take short walks every 1-2 hours. This is important to improve blood flow and breathing. Ask for help if you feel weak or unsteady. Do not lift anything that is heavier than 10 lb (4.5 kg), or the limit that you are told, until your health care provider says that it is safe. Do not play contact sports until your health care provider approves. Do not return to work or school until your health care provider approves. Return to your normal activities as told by your health care provider. Ask your health care provider what activities are safe for you. General instructions If you were given a sedative during the procedure, it can affect you for several hours. Do not drive or operate machinery until your health care provider says that it is safe. Keep all follow-up visits. This is important. Contact a health care provider if: You develop a rash. You have more redness, swelling, or pain around your incisions. You have fluid or blood coming from your incisions. Your incisions feel warm to the touch. You have pus or a bad smell coming  from your incisions. You have a fever. One or more of your incisions breaks open. Get help right away if: You have trouble breathing. You have chest pain. You have more pain in your shoulders. You faint or feel dizzy when you stand. You have severe pain in your abdomen. You have nausea or vomiting that lasts for more than one day. You have leg pain that is new or unusual, or if it is localized to one specific spot. These symptoms may represent a serious problem that is an emergency. Do not wait to see if the symptoms will go away. Get medical help right away. Call your local emergency services (911 in the [place] Do not drive yourself to the hospital. Summary After your procedure, it is common to have pain at the incision  sites. You may also have nausea or bloating. Follow your health care provider's instructions about medicine, activity restrictions, and caring for your incision areas. Do not do activities that require a lot of effort. Contact a health care provider if you have a fever or other signs of infection, such as more redness, swelling, or pain around the incisions. Get help right away if you have chest pain, increasing pain in the shoulders, or trouble breathing. This information is not intended to replace advice given to you by your health care provider. Make sure you discuss any questions you have with your health care provider. Document Revised: 07/12/2020 Document Reviewed: 07/13/2020 Elsevier Patient Education  2024 [employer].General Anesthesia, Adult, Care After The following information offers guidance on how to care for yourself after your procedure. Your health care provider may also give you more specific instructions. If you have problems or questions, contact your health care provider. What can I expect after the procedure? After the procedure, it is common for people to: Have pain or discomfort at the IV site. Have nausea or vomiting. Have a sore throat or  hoarseness. Have trouble concentrating. Feel cold or chills. Feel weak, sleepy, or tired (fatigue). Have soreness and body aches. These can affect parts of the body that were not involved in surgery. Follow these instructions at home: For the time period you were told by your health care provider:  Rest. Do not participate in activities where you could fall or become injured. Do not drive or use machinery. Do not drink alcohol. Do not take sleeping pills or medicines that cause drowsiness. Do not make important decisions or sign legal documents. Do not take care of children on your own. General instructions Drink enough fluid to keep your urine pale yellow. If you have sleep apnea, surgery and certain medicines can increase your risk for breathing problems. Follow instructions from your health care provider about wearing your sleep device: Anytime you are sleeping, including during daytime naps. While taking prescription pain medicines, sleeping medicines, or medicines that make you drowsy. Return to your normal activities as told by your health care provider. Ask your health care provider what activities are safe for you. Take over-the-counter and prescription medicines only as told by your health care provider. Do not use any products that contain nicotine or tobacco. These products include cigarettes, chewing tobacco, and vaping devices, such as e-cigarettes. These can delay incision healing after surgery. If you need help quitting, ask your health care provider. Contact a health care provider if: You have nausea or vomiting that does not get better with medicine. You vomit every time you eat or drink. You have pain that does not get better with medicine. You cannot urinate or have bloody urine. You develop a skin rash. You have a fever. Get help right away if: You have trouble breathing. You have chest pain. You vomit blood. These symptoms may be an emergency. Get help right  away. Call 911. Do not wait to see if the symptoms will go away. Do not drive yourself to the hospital. Summary After the procedure, it is common to have a sore throat, hoarseness, nausea, vomiting, or to feel weak, sleepy, or fatigue. For the time period you were told by your health care provider, do not drive or use machinery. Get help right away if you have difficulty breathing, have chest pain, or vomit blood. These symptoms may be an emergency. This information is not intended to replace advice given to you by  your health care provider. Make sure you discuss any questions you have with your health care provider. Document Revised: 04/08/2021 Document Reviewed: 04/08/2021 Elsevier Patient Education  2024 Else[employer] Inc.How to Use Chlorhexidine at Home in the Shower Chlorhexidine gluconate (CHG) is a germ-killing (antiseptic) wash that's used to clean the skin. It can get rid of the germs that normally live on the skin and can keep them away for about 24 hours. If you're having surgery, you may be told to shower with CHG at home the night before surgery. This can help lower your risk for infection. To use CHG wash in the shower, follow the steps below. Supplies needed: CHG body wash. Clean washcloth. Clean towel. How to use CHG in the shower Follow these steps unless you're told to use CHG in a different way: Start the shower. Use your normal soap and shampoo to wash your face and hair. Turn off the shower or move out of the shower stream. Pour CHG onto a clean washcloth. Do not use any type of brush or rough sponge. Start at your neck, washing your body down to your toes. Make sure you: Wash the part of your body where the surgery will be done for at least 1 minute. Do not scrub. Do not use CHG on your head or face unless your health care provider tells you to. If it gets into your ears or eyes, rinse them well with water. Do not wash your genitals with CHG. Wash your back and under your  arms. Make sure to wash skin folds. Let the CHG sit on your skin for 1-2 minutes or as long as told. Rinse your entire body in the shower, including all body creases and folds. Turn off the shower. Dry off with a clean towel. Do not put anything on your skin afterward, such as powder, lotion, or perfume. Put on clean clothes or pajamas. If it's the night before surgery, sleep in clean sheets. General tips Use CHG only as told, and follow the instructions on the label. Use the full amount of CHG as told. This is often one bottle. Do not smoke and stay away from flames after using CHG. Your skin may feel sticky after using CHG. This is normal. The sticky feeling will go away as the CHG dries. Do not use CHG: If you have a chlorhexidine allergy or have reacted to chlorhexidine in the past. On open wounds or areas of skin that have broken skin, cuts, or scrapes. On babies younger than 36 months of age. Contact a health care provider if: You have questions about using CHG. Your skin gets irritated or itchy. You have a rash after using CHG. You swallow any CHG. Call your local poison control center [phone] in the [place] Your eyes itch badly, or they become very red or swollen. Your hearing changes. You have trouble seeing. If you can't reach your provider, go to an urgent care or emergency room. Do not drive yourself. Get help right away if: You have swelling or tingling in your mouth or throat. You make high-pitched whistling sounds when you breathe, most often when you breathe out (wheeze). You have trouble breathing. These symptoms may be an emergency. Call 911 right away. Do not wait to see if the symptoms will go away. Do not drive yourself to the hospital. This information is not intended to replace advice given to you by your health care provider. Make sure you discuss any questions you have with your health care provider.  Document Revised: 07/25/2022 Document Reviewed:  07/21/2021 Elsevier Patient Education  2024 [employer].

## 2023-05-31 ENCOUNTER — Encounter (HOSPITAL_COMMUNITY)
Admission: RE | Admit: 2023-05-31 | Discharge: 2023-05-31 | Disposition: A | Discharge status: Home / Self Care | Source: Ambulatory Visit | Attending: General Surgery | Admitting: General Surgery

## 2023-05-31 DIAGNOSIS — Z01818 Encounter for other preprocedural examination: Secondary | ICD-10-CM | POA: Insufficient documentation

## 2023-05-31 LAB — COMPREHENSIVE METABOLIC PANEL WITH GFR
ALT: 12 U/L (ref 0–44)
AST: 13 U/L — ABNORMAL LOW (ref 15–41)
Albumin: 3.5 g/dL (ref 3.5–5.0)
Alkaline Phosphatase: 68 U/L (ref 38–126)
Anion gap: 10 (ref 5–15)
BUN: 23 mg/dL (ref 8–23)
CO2: 19 mmol/L — ABNORMAL LOW (ref 22–32)
Calcium: 9.3 mg/dL (ref 8.9–10.3)
Chloride: 108 mmol/L (ref 98–111)
Creatinine, Ser: 1.01 mg/dL — ABNORMAL HIGH (ref 0.44–1.00)
GFR, Estimated: 60 mL/min — ABNORMAL LOW (ref >60)
Glucose, Bld: 81 mg/dL (ref 70–99)
Potassium: 3.8 mmol/L (ref 3.5–5.1)
Sodium: 137 mmol/L (ref 135–145)
Total Bilirubin: 0.4 mg/dL (ref 0.0–1.2)
Total Protein: 6.4 g/dL — ABNORMAL LOW (ref 6.5–8.1)

## 2023-05-31 LAB — CBC WITH DIFFERENTIAL/PLATELET
Abs Immature Granulocytes: 0.01 10*3/uL (ref 0.00–0.07)
Basophils Absolute: 0 10*3/uL (ref 0.0–0.1)
Basophils Relative: 1 %
Eosinophils Absolute: 0.4 10*3/uL (ref 0.0–0.5)
Eosinophils Relative: 9 %
HCT: 33.1 % — ABNORMAL LOW (ref 36.0–46.0)
Hemoglobin: 10.6 g/dL — ABNORMAL LOW (ref 12.0–15.0)
Immature Granulocytes: 0 %
Lymphocytes Relative: 31 %
Lymphs Abs: 1.6 10*3/uL (ref 0.7–4.0)
MCH: 31.3 pg (ref 26.0–34.0)
MCHC: 32 g/dL (ref 30.0–36.0)
MCV: 97.6 fL (ref 80.0–100.0)
Monocytes Absolute: 0.5 10*3/uL (ref 0.1–1.0)
Monocytes Relative: 9 %
Neutro Abs: 2.6 10*3/uL (ref 1.7–7.7)
Neutrophils Relative %: 50 %
Platelets: 232 10*3/uL (ref 150–400)
RBC: 3.39 MIL/uL — ABNORMAL LOW (ref 3.87–5.11)
RDW: 13.9 % (ref 11.5–15.5)
WBC: 5.1 10*3/uL (ref 4.0–10.5)
nRBC: 0 % (ref 0.0–0.2)

## 2023-06-04 ENCOUNTER — Ambulatory Visit (HOSPITAL_BASED_OUTPATIENT_CLINIC_OR_DEPARTMENT_OTHER): Admitting: Certified Registered"

## 2023-06-04 ENCOUNTER — Ambulatory Visit (HOSPITAL_COMMUNITY): Admitting: Certified Registered"

## 2023-06-04 ENCOUNTER — Encounter (HOSPITAL_COMMUNITY)
Admission: RE | Disposition: A | Discharge status: Home / Self Care | Payer: Self-pay | Source: Home / Self Care | Attending: General Surgery

## 2023-06-04 ENCOUNTER — Ambulatory Visit (HOSPITAL_COMMUNITY)
Admission: RE | Admit: 2023-06-04 | Discharge: 2023-06-04 | Disposition: A | Discharge status: Home / Self Care | Attending: General Surgery | Admitting: General Surgery

## 2023-06-04 ENCOUNTER — Encounter (HOSPITAL_COMMUNITY): Payer: Self-pay | Admitting: General Surgery

## 2023-06-04 ENCOUNTER — Other Ambulatory Visit: Payer: Self-pay

## 2023-06-04 DIAGNOSIS — K219 Gastro-esophageal reflux disease without esophagitis: Secondary | ICD-10-CM | POA: Insufficient documentation

## 2023-06-04 DIAGNOSIS — K8020 Calculus of gallbladder without cholecystitis without obstruction: Secondary | ICD-10-CM | POA: Diagnosis not present

## 2023-06-04 DIAGNOSIS — K8010 Calculus of gallbladder with chronic cholecystitis without obstruction: Secondary | ICD-10-CM | POA: Diagnosis not present

## 2023-06-04 DIAGNOSIS — Z01818 Encounter for other preprocedural examination: Secondary | ICD-10-CM

## 2023-06-04 DIAGNOSIS — Z8711 Personal history of peptic ulcer disease: Secondary | ICD-10-CM | POA: Insufficient documentation

## 2023-06-04 DIAGNOSIS — I10 Essential (primary) hypertension: Secondary | ICD-10-CM | POA: Diagnosis not present

## 2023-06-04 SURGERY — CHOLECYSTECTOMY, ROBOT-ASSISTED, LAPAROSCOPIC
Anesthesia: General | Site: Abdomen

## 2023-06-04 MED ORDER — KETOROLAC TROMETHAMINE 30 MG/ML IJ SOLN
INTRAMUSCULAR | Status: DC | PRN
  Administered 2023-06-04: 30 mg via INTRAVENOUS

## 2023-06-04 MED ORDER — ORAL CARE MOUTH RINSE: 15.0000 mL | Freq: Once | OROMUCOSAL | Status: DC

## 2023-06-04 MED ORDER — CEFAZOLIN SODIUM-DEXTROSE 2-4 GM/100ML-% IV SOLN
INTRAVENOUS | Status: AC
  Filled 2023-06-04: qty 100

## 2023-06-04 MED ORDER — ROCURONIUM BROMIDE 10 MG/ML (PF) SYRINGE
PREFILLED_SYRINGE | INTRAVENOUS | Status: DC | PRN
  Administered 2023-06-04: 70 mg via INTRAVENOUS

## 2023-06-04 MED ORDER — ONDANSETRON HCL 4 MG/2ML IJ SOLN: 4.0000 mg | Freq: Once | INTRAMUSCULAR | Status: DC | PRN

## 2023-06-04 MED ORDER — INDOCYANINE GREEN 25 MG IV SOLR
INTRAVENOUS | Status: AC
  Filled 2023-06-04: qty 10

## 2023-06-04 MED ORDER — ROCURONIUM BROMIDE 10 MG/ML (PF) SYRINGE
PREFILLED_SYRINGE | INTRAVENOUS | Status: AC
  Filled 2023-06-04: qty 10

## 2023-06-04 MED ORDER — DEXAMETHASONE SODIUM PHOSPHATE 10 MG/ML IJ SOLN
INTRAMUSCULAR | Status: DC | PRN
  Administered 2023-06-04: 5 mg via INTRAVENOUS

## 2023-06-04 MED ORDER — FENTANYL CITRATE PF 50 MCG/ML IJ SOSY
25.0000 ug | PREFILLED_SYRINGE | INTRAMUSCULAR | Status: DC | PRN
  Administered 2023-06-04: 50 ug via INTRAVENOUS
  Filled 2023-06-04: qty 1

## 2023-06-04 MED ORDER — LACTATED RINGERS IV SOLN: INTRAVENOUS | Status: DC | PRN

## 2023-06-04 MED ORDER — TRAMADOL HCL 50 MG PO TABS: 50.0000 mg | ORAL_TABLET | Freq: Four times a day (QID) | ORAL | 0 refills | Status: AC | PRN

## 2023-06-04 MED ORDER — CHLORHEXIDINE GLUCONATE 0.12 % MT SOLN
15.0000 mL | Freq: Once | OROMUCOSAL | Status: AC
  Administered 2023-06-04: 15 mL via OROMUCOSAL

## 2023-06-04 MED ORDER — CHLORHEXIDINE GLUCONATE CLOTH 2 % EX PADS: 6.0000 | MEDICATED_PAD | Freq: Once | CUTANEOUS | Status: DC

## 2023-06-04 MED ORDER — PROPOFOL 10 MG/ML IV BOLUS
INTRAVENOUS | Status: DC | PRN
  Administered 2023-06-04: 150 mg via INTRAVENOUS

## 2023-06-04 MED ORDER — KETOROLAC TROMETHAMINE 30 MG/ML IJ SOLN
INTRAMUSCULAR | Status: AC
  Filled 2023-06-04: qty 1

## 2023-06-04 MED ORDER — PROPOFOL 10 MG/ML IV BOLUS
INTRAVENOUS | Status: AC
  Filled 2023-06-04: qty 20

## 2023-06-04 MED ORDER — FENTANYL CITRATE (PF) 100 MCG/2ML IJ SOLN
INTRAMUSCULAR | Status: AC
  Filled 2023-06-04: qty 2

## 2023-06-04 MED ORDER — ONDANSETRON HCL 4 MG/2ML IJ SOLN
INTRAMUSCULAR | Status: AC
  Filled 2023-06-04: qty 2

## 2023-06-04 MED ORDER — LIDOCAINE 2% (20 MG/ML) 5 ML SYRINGE
INTRAMUSCULAR | Status: DC | PRN
  Administered 2023-06-04: 60 mg via INTRAVENOUS

## 2023-06-04 MED ORDER — CHLORHEXIDINE GLUCONATE CLOTH 2 % EX PADS
6.0000 | MEDICATED_PAD | Freq: Once | CUTANEOUS | Status: AC
  Administered 2023-06-04: 6 via TOPICAL

## 2023-06-04 MED ORDER — LIDOCAINE 2% (20 MG/ML) 5 ML SYRINGE
INTRAMUSCULAR | Status: AC
  Filled 2023-06-04: qty 5

## 2023-06-04 MED ORDER — INDOCYANINE GREEN 25 MG IV SOLR
2.5000 mg | Freq: Once | INTRAVENOUS | Status: AC
  Administered 2023-06-04: 2.5 mg via INTRAVENOUS

## 2023-06-04 MED ORDER — SUGAMMADEX SODIUM 200 MG/2ML IV SOLN
INTRAVENOUS | Status: DC | PRN
  Administered 2023-06-04: 100 mg via INTRAVENOUS
  Administered 2023-06-04 (×2): 50 mg via INTRAVENOUS

## 2023-06-04 MED ORDER — ONDANSETRON HCL 4 MG/2ML IJ SOLN
INTRAMUSCULAR | Status: DC | PRN
  Administered 2023-06-04: 4 mg via INTRAVENOUS

## 2023-06-04 MED ORDER — CHLORHEXIDINE GLUCONATE 0.12 % MT SOLN: 15.0000 mL | Freq: Once | OROMUCOSAL | Status: DC

## 2023-06-04 MED ORDER — BUPIVACAINE HCL (PF) 0.5 % IJ SOLN
INTRAMUSCULAR | Status: AC
  Filled 2023-06-04: qty 30

## 2023-06-04 MED ORDER — FENTANYL CITRATE (PF) 100 MCG/2ML IJ SOLN
INTRAMUSCULAR | Status: DC | PRN
  Administered 2023-06-04 (×2): 50 ug via INTRAVENOUS

## 2023-06-04 MED ORDER — OXYCODONE HCL 5 MG PO TABS: 5.0000 mg | ORAL_TABLET | Freq: Once | ORAL | Status: DC | PRN

## 2023-06-04 MED ORDER — PHENYLEPHRINE HCL-NACL 20-0.9 MG/250ML-% IV SOLN
INTRAVENOUS | Status: AC
  Filled 2023-06-04: qty 250

## 2023-06-04 MED ORDER — MIDAZOLAM HCL 2 MG/2ML IJ SOLN
INTRAMUSCULAR | Status: DC | PRN
  Administered 2023-06-04: 2 mg via INTRAVENOUS

## 2023-06-04 MED ORDER — SCOPOLAMINE 1 MG/3DAYS TD PT72
MEDICATED_PATCH | TRANSDERMAL | Status: AC
  Filled 2023-06-04: qty 1

## 2023-06-04 MED ORDER — CEFAZOLIN SODIUM-DEXTROSE 2-4 GM/100ML-% IV SOLN
2.0000 g | INTRAVENOUS | Status: AC
  Administered 2023-06-04: 2 g via INTRAVENOUS

## 2023-06-04 MED ORDER — SCOPOLAMINE 1 MG/3DAYS TD PT72
1.0000 | MEDICATED_PATCH | Freq: Once | TRANSDERMAL | Status: DC
  Administered 2023-06-04: 1.5 mg via TRANSDERMAL

## 2023-06-04 MED ORDER — DEXAMETHASONE SODIUM PHOSPHATE 10 MG/ML IJ SOLN
INTRAMUSCULAR | Status: AC
  Filled 2023-06-04: qty 1

## 2023-06-04 MED ORDER — BUPIVACAINE HCL (PF) 0.5 % IJ SOLN
INTRAMUSCULAR | Status: DC | PRN
  Administered 2023-06-04: 30 mL

## 2023-06-04 MED ORDER — PHENYLEPHRINE 80 MCG/ML (10ML) SYRINGE FOR IV PUSH (FOR BLOOD PRESSURE SUPPORT)
PREFILLED_SYRINGE | INTRAVENOUS | Status: DC | PRN
  Administered 2023-06-04: 160 ug via INTRAVENOUS
  Administered 2023-06-04: 80 ug via INTRAVENOUS

## 2023-06-04 MED ORDER — STERILE WATER FOR IRRIGATION IR SOLN
Status: DC | PRN
  Administered 2023-06-04: 500 mL

## 2023-06-04 MED ORDER — LACTATED RINGERS IV SOLN: INTRAVENOUS | Status: DC

## 2023-06-04 MED ORDER — OXYCODONE HCL 5 MG/5ML PO SOLN: 5.0000 mg | Freq: Once | ORAL | Status: DC | PRN

## 2023-06-04 MED ORDER — MIDAZOLAM HCL 2 MG/2ML IJ SOLN
INTRAMUSCULAR | Status: AC
  Filled 2023-06-04: qty 2

## 2023-06-04 SURGICAL SUPPLY — 35 items
CAUTERY HOOK MNPLR 1.6 DVNC XI (INSTRUMENTS) ×1 IMPLANT
CHLORAPREP W/TINT 26 (MISCELLANEOUS) ×1 IMPLANT
CLIP LIGATING HEM O LOK PURPLE (MISCELLANEOUS) ×1 IMPLANT
COVER LIGHT HANDLE STERIS (MISCELLANEOUS) ×1 IMPLANT
DERMABOND ADVANCED .7 DNX12 (GAUZE/BANDAGES/DRESSINGS) ×1 IMPLANT
DRAPE ARM DVNC X/XI (DISPOSABLE) ×4 IMPLANT
DRAPE COLUMN DVNC XI (DISPOSABLE) ×1 IMPLANT
ELECTRODE REM PT RTRN 9FT ADLT (ELECTROSURGICAL) ×1 IMPLANT
FORCEPS BPLR R/ABLATION 8 DVNC (INSTRUMENTS) ×1 IMPLANT
FORCEPS PROGRASP DVNC XI (FORCEP) ×1 IMPLANT
GLOVE BIO SURGEON STRL SZ7 (GLOVE) IMPLANT
GLOVE BIOGEL PI IND STRL 7.0 (GLOVE) ×2 IMPLANT
GLOVE BIOGEL PI IND STRL 7.5 (GLOVE) IMPLANT
GLOVE SURG SS PI 7.5 STRL IVOR (GLOVE) ×2 IMPLANT
GOWN STRL REUS W/TWL LRG LVL3 (GOWN DISPOSABLE) ×3 IMPLANT
KIT TURNOVER KIT A (KITS) ×1 IMPLANT
MANIFOLD NEPTUNE II (INSTRUMENTS) ×1 IMPLANT
NDL HYPO 21X1.5 SAFETY (NEEDLE) ×1 IMPLANT
NDL INSUFFLATION 14GA 120MM (NEEDLE) ×1 IMPLANT
NEEDLE HYPO 21X1.5 SAFETY (NEEDLE) ×1 IMPLANT
NEEDLE INSUFFLATION 14GA 120MM (NEEDLE) ×1 IMPLANT
OBTURATOR OPTICALSTD 8 DVNC (TROCAR) ×1 IMPLANT
PACK LAP CHOLE LZT030E (CUSTOM PROCEDURE TRAY) ×1 IMPLANT
PAD ARMBOARD POSITIONER FOAM (MISCELLANEOUS) ×1 IMPLANT
PENCIL HANDSWITCHING (ELECTRODE) ×1 IMPLANT
POSITIONER HEAD 8X9X4 ADT (SOFTGOODS) ×1 IMPLANT
SEAL UNIV 5-12 XI (MISCELLANEOUS) ×4 IMPLANT
SET BASIN LINEN APH (SET/KITS/TRAYS/PACK) ×1 IMPLANT
SET TUBE SMOKE EVAC HIGH FLOW (TUBING) ×1 IMPLANT
SPIKE FLUID TRANSFER (MISCELLANEOUS) ×1 IMPLANT
SUT MNCRL AB 4-0 PS2 18 (SUTURE) ×2 IMPLANT
SUT VICRYL 0 UR6 27IN ABS (SUTURE) IMPLANT
SYR 30ML LL (SYRINGE) ×1 IMPLANT
SYSTEM RETRIEVL 5MM INZII UNIV (BASKET) ×1 IMPLANT
WATER STERILE IRR 500ML POUR (IV SOLUTION) ×1 IMPLANT

## 2023-06-04 NOTE — Anesthesia Preprocedure Evaluation (Signed)
 Anesthesia Evaluation  Patient identified by MRN, date of birth, ID band Patient awake    Reviewed: Allergy & Precautions, H&P , NPO status , Patient's Chart, lab work & pertinent test results, reviewed documented beta blocker date and time   Airway Mallampati: II  TM Distance: >3 FB Neck ROM: full    Dental no notable dental hx.    Pulmonary neg pulmonary ROS   Pulmonary exam normal breath sounds clear to auscultation       Cardiovascular Exercise Tolerance: Good hypertension, negative cardio ROS  Rhythm:regular Rate:Normal     Neuro/Psych negative neurological ROS  negative psych ROS   GI/Hepatic negative GI ROS, Neg liver ROS, PUD,GERD  ,,  Endo/Other  negative endocrine ROS    Renal/GU negative Renal ROS  negative genitourinary   Musculoskeletal   Abdominal   Peds  Hematology negative hematology ROS (+)   Anesthesia Other Findings   Reproductive/Obstetrics negative OB ROS                             Anesthesia Physical Anesthesia Plan  ASA: 2  Anesthesia Plan: General and General ETT   Post-op Pain Management:    Induction:   PONV Risk Score and Plan: Ondansetron  and Scopolamine  patch - Pre-op  Airway Management Planned:   Additional Equipment:   Intra-op Plan:   Post-operative Plan:   Informed Consent: I have reviewed the patients History and Physical, chart, labs and discussed the procedure including the risks, benefits and alternatives for the proposed anesthesia with the patient or authorized representative who has indicated his/her understanding and acceptance.     Dental Advisory Given  Plan Discussed with: CRNA  Anesthesia Plan Comments:        Anesthesia Quick Evaluation

## 2023-06-04 NOTE — Transfer of Care (Signed)
 Immediate Anesthesia Transfer of Care Note  Patient: Katie Chan  Procedure(s) Performed: CHOLECYSTECTOMY, ROBOT-ASSISTED, LAPAROSCOPIC (Abdomen)  Patient Location: PACU  Anesthesia Type:General  Level of Consciousness: drowsy  Airway & Oxygen Therapy: Patient Spontanous Breathing and Patient connected to face mask oxygen  Post-op Assessment: Report given to RN and Post -op Vital signs reviewed and stable  Post vital signs: Reviewed and stable  Last Vitals:  Vitals Value Taken Time  BP 160/66   Temp 97.0   Pulse 59 06/04/23 0847  Resp 17 06/04/23 0847  SpO2 100 % 06/04/23 0847  Vitals shown include unfiled device data.  Last Pain:  Vitals:   06/04/23 0645  PainSc: 5          Complications: No notable events documented.

## 2023-06-04 NOTE — Anesthesia Procedure Notes (Addendum)
 Procedure Name: Intubation Date/Time: 06/04/2023 7:41 AM  Performed by: Coretha Dew, MDPre-anesthesia Checklist: Patient identified, Emergency Drugs available, Suction available and Patient being monitored Patient Re-evaluated:Patient Re-evaluated prior to induction Oxygen Delivery Method: Circle system utilized Preoxygenation: Pre-oxygenation with 100% oxygen Induction Type: IV induction Ventilation: Mask ventilation without difficulty and Oral airway inserted - appropriate to patient size Laryngoscope Size: Annabell Key and 2 Grade View: Grade I Tube type: Oral Tube size: 7.0 mm Number of attempts: 1 Airway Equipment and Method: Stylet and Oral airway Placement Confirmation: positive ETCO2, ETT inserted through vocal cords under direct vision and breath sounds checked- equal and bilateral Secured at: 21 cm Tube secured with: Tape Dental Injury: Teeth and Oropharynx as per pre-operative assessment  Comments: DL and intubation per Arvin Bishop SRNA

## 2023-06-04 NOTE — Discharge Instructions (Signed)

## 2023-06-04 NOTE — Interval H&P Note (Signed)
 History and Physical Interval Note:  06/04/2023 7:16 AM  Katie Chan  has presented today for surgery, with the diagnosis of CHOLELITHIASIS.  The various methods of treatment have been discussed with the patient and family. After consideration of risks, benefits and other options for treatment, the patient has consented to  Procedure(s): CHOLECYSTECTOMY, ROBOT-ASSISTED, LAPAROSCOPIC (N/A) as a surgical intervention.  The patient's history has been reviewed, patient examined, no change in status, stable for surgery.  I have reviewed the patient's chart and labs.  Questions were answered to the patient's satisfaction.     [name]

## 2023-06-04 NOTE — Op Note (Signed)
 Patient:  Katie Chan  DOB:  1950-04-23  MRN:  [id number]   Preop Diagnosis: Cholelithiasis, status post ERCP for choledocholithiasis  Postop Diagnosis: Same  Procedure: Robotic assisted laparoscopic cholecystectomy  Surgeon: [name], MD  Anes: General endotracheal  Indications: Patient is a 73 year old white female status post ERCP for choledocholithiasis who now presents for a robotic assisted laparoscopic cholecystectomy for cholelithiasis.  The risks and benefits of the procedure including bleeding, infection, hepatobiliary injury, the possibility of an open procedure were fully explained to the patient, who gave informed consent.  Procedure note: The patient was placed in the supine position.  After induction of general endotracheal anesthesia, the abdomen was prepped and draped using the usual sterile technique with ChloraPrep.  Surgical site confirmation was performed.  An infraumbilical incision was made down to the fascia.  A Veress needle was introduced into the abdominal cavity and confirmation of placement was done using the saline drop test.  The abdomen was then insufflated to 15 mmHg pressure.  An 8 mm trocar was introduced into the abdominal cavity under direct visualization without difficulty.  The patient was placed in reverse Trendelenburg position and additional 8 mm trocars were placed in the left upper quadrant, right lower quadrant, and right flank regions.  The robot was then docked and targeted.  The liver appeared to be within normal limits.  The gallbladder was very elongated.  It was retracted in a dynamic fashion in order to provide a critical view of the triangle of Calot.  The cystic artery actually was first visualized and tracked up the right side of the gallbladder, making it more dumbbell shaped.  This was confirmed by firefly.  Hem-o-lok clips were placed proximally and distally on the cystic artery and the cystic artery was divided.  The cystic duct  was then fully identified.  Hem-o-lok clips were placed proximally distally on the cystic duct and the cystic duct was divided.  The gallbladder was then freed away from the gallbladder fossa using Bovie electrocautery.  The gallbladder was delivered through the left upper quadrant trocar site using an Endo Catch bag.  The gallbladder fossa was inspected no abnormal bleeding or bile leakage was noted.  The robot was undocked and all air was evacuated from the abdominal cavity prior to the removal of the trocars.  All wounds were irrigated with normal saline.  All wounds were injected with 0.5% Sensorcaine .  The left upper quadrant fascia as well as infraumbilical fascia were reapproximated using an 0 Vicryl interrupted suture.  All skin incisions were closed using a 4-0 Monocryl subcuticular suture.  Dermabond was applied.  All tape and needle counts were correct at the end of the procedure.  The patient was extubated in the operating room and transferred to PACU in stable condition.  Complications: None  EBL: Minimal  Specimen: Gallbladder

## 2023-06-05 LAB — SURGICAL PATHOLOGY

## 2023-06-07 NOTE — Anesthesia Postprocedure Evaluation (Signed)
 Anesthesia Post Note  Patient: Katie Chan  Procedure(s) Performed: CHOLECYSTECTOMY, ROBOT-ASSISTED, LAPAROSCOPIC (Abdomen)  Patient location during evaluation: Phase II Anesthesia Type: General Level of consciousness: awake Pain management: pain level controlled Vital Signs Assessment: post-procedure vital signs reviewed and stable Respiratory status: spontaneous breathing and respiratory function stable Cardiovascular status: blood pressure returned to baseline and stable Postop Assessment: no headache and no apparent nausea or vomiting Anesthetic complications: no Comments: Late entry   No notable events documented.   Last Vitals:  Vitals:   06/04/23 0930 06/04/23 0946  BP: (!) 124/58 (!) 156/71  Pulse: 63 61  Resp: 12 16  Temp:  36.5 C  SpO2: 100% 98%    Last Pain:  Vitals:   06/04/23 0946  TempSrc: Oral  PainSc: 4                  Coretha Dew

## 2023-06-14 ENCOUNTER — Ambulatory Visit (INDEPENDENT_AMBULATORY_CARE_PROVIDER_SITE_OTHER): Admitting: General Surgery

## 2023-06-14 ENCOUNTER — Encounter: Payer: Self-pay | Admitting: General Surgery

## 2023-06-14 DIAGNOSIS — Z09 Encounter for follow-up examination after completed treatment for conditions other than malignant neoplasm: Secondary | ICD-10-CM

## 2023-06-14 DIAGNOSIS — K8020 Calculus of gallbladder without cholecystitis without obstruction: Secondary | ICD-10-CM

## 2023-06-14 NOTE — Progress Notes (Signed)
 Subjective:     Katie Chan  Virtual telephone postoperative visit performed with patient.  She was at home and I was in the office.  She states she is doing well.  Her preoperative symptoms have resolved.  Her energy level is getting better.  She was fatigued initially.  She denies any fever, chills, nausea, or vomiting.  Patient states she recently had blood work through her primary care physician and the only thing mildly elevated was the alkaline phosphatase.  I told her that was not uncommon after gallbladder surgery. Objective:    There were no vitals taken for this visit.  General:  alert, cooperative, and no distress  Final pathology consistent with diagnosis.     Assessment:    Doing well postoperatively.    Plan:   May gradually resume normal activity.  Follow-up here as needed.  Total telephone time was 3 minutes.  As this was a part of the total global surgical fee, this was not a billable visit.

## 2023-06-30 DIAGNOSIS — M545 Low back pain, unspecified: Secondary | ICD-10-CM | POA: Diagnosis not present

## 2023-06-30 DIAGNOSIS — Z6824 Body mass index (BMI) 24.0-24.9, adult: Secondary | ICD-10-CM | POA: Diagnosis not present

## 2023-07-24 DIAGNOSIS — M4316 Spondylolisthesis, lumbar region: Secondary | ICD-10-CM | POA: Diagnosis not present

## 2023-07-24 DIAGNOSIS — N183 Chronic kidney disease, stage 3 unspecified: Secondary | ICD-10-CM | POA: Diagnosis not present

## 2023-07-24 DIAGNOSIS — M25561 Pain in right knee: Secondary | ICD-10-CM | POA: Diagnosis not present

## 2023-07-24 DIAGNOSIS — M25551 Pain in right hip: Secondary | ICD-10-CM | POA: Diagnosis not present

## 2023-09-01 DIAGNOSIS — M545 Low back pain, unspecified: Secondary | ICD-10-CM | POA: Diagnosis not present

## 2023-09-03 DIAGNOSIS — R300 Dysuria: Secondary | ICD-10-CM | POA: Diagnosis not present

## 2023-09-03 DIAGNOSIS — N390 Urinary tract infection, site not specified: Secondary | ICD-10-CM | POA: Diagnosis not present

## 2023-09-03 DIAGNOSIS — M4316 Spondylolisthesis, lumbar region: Secondary | ICD-10-CM | POA: Diagnosis not present

## 2023-09-03 DIAGNOSIS — I10 Essential (primary) hypertension: Secondary | ICD-10-CM | POA: Diagnosis not present

## 2023-09-03 DIAGNOSIS — R634 Abnormal weight loss: Secondary | ICD-10-CM | POA: Diagnosis not present

## 2023-09-19 DIAGNOSIS — R590 Localized enlarged lymph nodes: Secondary | ICD-10-CM | POA: Diagnosis not present

## 2023-09-19 DIAGNOSIS — R300 Dysuria: Secondary | ICD-10-CM | POA: Diagnosis not present

## 2023-09-19 DIAGNOSIS — R5383 Other fatigue: Secondary | ICD-10-CM | POA: Diagnosis not present

## 2023-09-19 DIAGNOSIS — N390 Urinary tract infection, site not specified: Secondary | ICD-10-CM | POA: Diagnosis not present

## 2023-10-01 DIAGNOSIS — M4316 Spondylolisthesis, lumbar region: Secondary | ICD-10-CM | POA: Diagnosis not present

## 2023-10-02 DIAGNOSIS — R300 Dysuria: Secondary | ICD-10-CM | POA: Diagnosis not present

## 2023-10-04 ENCOUNTER — Encounter: Payer: Self-pay | Admitting: Pharmacist

## 2023-10-04 ENCOUNTER — Encounter (INDEPENDENT_AMBULATORY_CARE_PROVIDER_SITE_OTHER): Payer: Self-pay | Admitting: *Deleted

## 2023-10-04 NOTE — Progress Notes (Signed)
   10/04/2023  Patient ID: Katie Chan, female   DOB: 12-Jun-1950, 73 y.o.   MRN: [id number]  Pharmacy Quality Measure Review  This patient is appearing on a report for being at risk of failing the adherence measure for hypertension (ACEi/ARB) medications this calendar year.   Medication: Lisinopril 40 mg  Last fill date: 09/11/23 for 90 day supply  Insurance report was not up to date. No action needed at this time.   Reviewed Dr. Annemarie Cassius DOROTHA Jolee, PharmD, [facility] Clinical Pharmacist [phone]

## 2023-10-08 DIAGNOSIS — M4807 Spinal stenosis, lumbosacral region: Secondary | ICD-10-CM | POA: Diagnosis not present

## 2023-10-08 DIAGNOSIS — M48061 Spinal stenosis, lumbar region without neurogenic claudication: Secondary | ICD-10-CM | POA: Diagnosis not present

## 2023-10-08 DIAGNOSIS — M4316 Spondylolisthesis, lumbar region: Secondary | ICD-10-CM | POA: Diagnosis not present

## 2023-10-24 DIAGNOSIS — M2550 Pain in unspecified joint: Secondary | ICD-10-CM | POA: Diagnosis not present

## 2023-10-24 DIAGNOSIS — R300 Dysuria: Secondary | ICD-10-CM | POA: Diagnosis not present

## 2023-10-24 DIAGNOSIS — N189 Chronic kidney disease, unspecified: Secondary | ICD-10-CM | POA: Diagnosis not present

## 2023-10-24 DIAGNOSIS — M545 Low back pain, unspecified: Secondary | ICD-10-CM | POA: Diagnosis not present

## 2023-11-07 ENCOUNTER — Encounter (INDEPENDENT_AMBULATORY_CARE_PROVIDER_SITE_OTHER): Payer: Self-pay | Admitting: Gastroenterology

## 2023-11-15 DIAGNOSIS — M5416 Radiculopathy, lumbar region: Secondary | ICD-10-CM | POA: Diagnosis not present

## 2023-12-11 ENCOUNTER — Ambulatory Visit: Admitting: Urology

## 2024-02-11 NOTE — Progress Notes (Unsigned)
 "  Chief Complaint: No chief complaint on file.   History of Present Illness:  Katie Chan is a 74 y.o. female who is seen in consultation from [place], [place], [place] (Inactive) for evaluation of ***.   Past Medical History:  Past Medical History:  Diagnosis Date   Arthritis    Carotid artery disease    Essential hypertension    Fracture of radius, distal, right, closed 05/04/2015   GERD (gastroesophageal reflux disease)     Past Surgical History:  Past Surgical History:  Procedure Laterality Date   BALLOON DILATION  11/16/2020   Procedure: BALLOON DILATION;  Surgeon: Eartha Angelia Sieving, MD;  Location: [facility];  Service: Gastroenterology;;   MERRILL DILATION  02/13/2023   Procedure: MERRILL HODGKIN;  Surgeon: Eartha Angelia Sieving, MD;  Location: [facility];  Service: Gastroenterology;;   MERRILL DILATION  03/21/2023   Procedure: MERRILL HODGKIN;  Surgeon: Eartha Angelia Sieving, MD;  Location: [facility];  Service: Gastroenterology;;   BIOPSY  01/22/2023   Procedure: BIOPSY;  Surgeon: Wilhelmenia Aloha Raddle., MD;  Location: [facility];  Service: Gastroenterology;;   ENDOSCOPIC RETROGRADE CHOLANGIOPANCREATOGRAPHY (ERCP) WITH PROPOFOL  N/A 05/07/2023   Procedure: ENDOSCOPIC RETROGRADE CHOLANGIOPANCREATOGRAPHY (ERCP) WITH PROPOFOL ;  Surgeon: Wilhelmenia Aloha Raddle., MD;  Location: [facility];  Service: Gastroenterology;  Laterality: N/A;   ERCP N/A 01/22/2023   Procedure: ENDOSCOPIC RETROGRADE CHOLANGIOPANCREATOGRAPHY (ERCP);  Surgeon: Wilhelmenia Aloha Raddle., MD;  Location: [facility];  Service: Gastroenterology;  Laterality: N/A;  cancelled due to pylori ulcer   ESOPHAGOGASTRODUODENOSCOPY N/A 01/22/2023   Procedure: ESOPHAGOGASTRODUODENOSCOPY (EGD);  Surgeon: Wilhelmenia Aloha Raddle., MD;  Location: [facility];  Service: Gastroenterology;  Laterality: N/A;   ESOPHAGOGASTRODUODENOSCOPY (EGD) WITH PROPOFOL  N/A 11/16/2020   Procedure:  ESOPHAGOGASTRODUODENOSCOPY (EGD) WITH PROPOFOL ;  Surgeon: Eartha Angelia Sieving, MD;  Location: [facility];  Service: Gastroenterology;  Laterality: N/A;  7:30   ESOPHAGOGASTRODUODENOSCOPY (EGD) WITH PROPOFOL  N/A 02/13/2023   Procedure: ESOPHAGOGASTRODUODENOSCOPY (EGD) WITH PROPOFOL ;  Surgeon: Eartha Angelia Sieving, MD;  Location: [facility];  Service: Gastroenterology;  Laterality: N/A;  2:00pm;asa 1-2   ESOPHAGOGASTRODUODENOSCOPY (EGD) WITH PROPOFOL  N/A 03/06/2023   Procedure: ESOPHAGOGASTRODUODENOSCOPY (EGD) WITH PROPOFOL ;  Surgeon: Eartha Angelia Sieving, MD;  Location: [facility];  Service: Gastroenterology;  Laterality: N/A;  1:45PM;ASA 1-2   ESOPHAGOGASTRODUODENOSCOPY (EGD) WITH PROPOFOL  N/A 03/21/2023   Procedure: ESOPHAGOGASTRODUODENOSCOPY (EGD) WITH PROPOFOL ;  Surgeon: Eartha Angelia Sieving, MD;  Location: [facility];  Service: Gastroenterology;  Laterality: N/A;  1:00PM;ASA 1-2   OPEN REDUCTION INTERNAL FIXATION (ORIF) DISTAL RADIAL FRACTURE Right 05/04/2015   Procedure: OPEN REDUCTION INTERNAL FIXATION (ORIF) RIGHT DISTAL RADIUS FRACTURE;  Surgeon: Fonda Olmsted, MD;  Location: [facility];  Service: Orthopedics;  Laterality: Right;   SPHINCTEROTOMY  05/07/2023   Procedure: SPHINCTEROTOMY;  Surgeon: Mansouraty, Aloha Raddle., MD;  Location: [facility];  Service: Gastroenterology;;   STONE EXTRACTION WITH BASKET  05/07/2023   Procedure: ERCP, WITH LITHROTRIPSY OR REMOVAL OF COMMON BILE DUCT CALCULUS USING BASKET;  Surgeon: Wilhelmenia Aloha Raddle., MD;  Location: [facility];  Service: Gastroenterology;;   TONSILLECTOMY AND ADENOIDECTOMY      Allergies:  Allergies[1]  Family History:  Family History  Problem Relation Age of Onset   Heart disease Mother        stents placed in heart, PPM   Diabetes Mother    Heart attack Mother    Heart disease Father    Heart attack Father    COPD Father    Diabetes Sister  Cancer Maternal Grandfather     Heart attack Paternal Grandfather 84       massive heart attack   Colon cancer Neg Hx    Colon polyps Neg Hx     Social History:  Social History[2]  Review of symptoms:  Constitutional:  Negative for unexplained weight loss, night sweats, fever, chills ENT:  Negative for nose bleeds, sinus pain, painful swallowing CV:  Negative for chest pain, shortness of breath, exercise intolerance, palpitations, loss of consciousness Resp:  Negative for cough, wheezing, shortness of breath GI:  Negative for nausea, vomiting, diarrhea, bloody stools GU:  Positives noted in HPI; otherwise negative for gross hematuria, dysuria, urinary incontinence Neuro:  Negative for seizures, poor balance, limb weakness, slurred speech Psych:  Negative for lack of energy, depression, anxiety Endocrine:  Negative for polydipsia, polyuria, symptoms of hypoglycemia (dizziness, hunger, sweating) Hematologic:  Negative for anemia, purpura, petechia, prolonged or excessive bleeding, use of anticoagulants  Allergic:  Negative for difficulty breathing or choking as a result of exposure to anything; no shellfish allergy; no allergic response (rash/itch) to materials, foods  Physical exam: There were no vitals taken for this visit. GENERAL APPEARANCE:  Well appearing, well developed, well nourished, NAD HEENT: Atraumatic, Normocephalic, oropharynx clear. NECK: Supple without lymphadenopathy or thyromegaly. LUNGS: Clear to auscultation bilaterally. HEART: Regular Rate and Rhythm without murmurs, gallops, or rubs. ABDOMEN: Soft, non-tender, No Masses. EXTREMITIES: Moves all extremities well.  Without clubbing, cyanosis, or edema. NEUROLOGIC:  Alert and oriented x 3, normal gait, CN II-XII grossly intact.  MENTAL STATUS:  Appropriate. BACK:  Non-tender to palpation.  No CVAT SKIN:  Warm, dry and intact.    Results: No results found for this or any previous visit (from the past 24 hours).  I have reviewed prior patient's  records  I have reviewed referring/prior physicians records  I have reviewed urinalysis  I have reviewed prior urine cultures  I reviewed prior imaging studies  Assessment: No diagnosis found.   Plan: ***     [1] No Known Allergies [2]  Social History Tobacco Use   Smoking status: Never   Smokeless tobacco: Never  Vaping Use   Vaping status: Never Used  Substance Use Topics   Alcohol use: Never   Drug use: Never   "

## 2024-02-12 ENCOUNTER — Ambulatory Visit: Admitting: Urology

## 2024-02-12 DIAGNOSIS — N952 Postmenopausal atrophic vaginitis: Secondary | ICD-10-CM

## 2024-02-12 DIAGNOSIS — N390 Urinary tract infection, site not specified: Secondary | ICD-10-CM
